# Patient Record
Sex: Female | Born: 1975 | Hispanic: Yes | Marital: Single | Smoking: Current some day smoker
Health system: Southern US, Community
[De-identification: ages and names within clinical notes are randomized; demographics above are authoritative.]

## PROBLEM LIST (undated history)

## (undated) ENCOUNTER — Inpatient Hospital Stay (HOSPITAL_COMMUNITY): Payer: Self-pay

## (undated) DIAGNOSIS — O368190 Decreased fetal movements, unspecified trimester, not applicable or unspecified: Secondary | ICD-10-CM

## (undated) DIAGNOSIS — Z8742 Personal history of other diseases of the female genital tract: Secondary | ICD-10-CM

## (undated) DIAGNOSIS — M5430 Sciatica, unspecified side: Secondary | ICD-10-CM

## (undated) DIAGNOSIS — D649 Anemia, unspecified: Secondary | ICD-10-CM

## (undated) DIAGNOSIS — E119 Type 2 diabetes mellitus without complications: Secondary | ICD-10-CM

## (undated) DIAGNOSIS — Z789 Other specified health status: Secondary | ICD-10-CM

## (undated) HISTORY — PX: KNEE SURGERY: SHX244

---

## 2007-01-07 HISTORY — PX: ABDOMINAL SURGERY: SHX537

## 2013-09-14 ENCOUNTER — Encounter: Payer: Self-pay | Admitting: Gynecology

## 2013-09-14 ENCOUNTER — Ambulatory Visit (INDEPENDENT_AMBULATORY_CARE_PROVIDER_SITE_OTHER): Payer: BC Managed Care – PPO | Admitting: Gynecology

## 2013-09-14 ENCOUNTER — Other Ambulatory Visit (HOSPITAL_COMMUNITY)
Admission: RE | Admit: 2013-09-14 | Discharge: 2013-09-14 | Disposition: A | Discharge status: Home / Self Care | Payer: BC Managed Care – PPO | Source: Ambulatory Visit | Attending: Gynecology | Admitting: Gynecology

## 2013-09-14 ENCOUNTER — Other Ambulatory Visit: Payer: BC Managed Care – PPO

## 2013-09-14 ENCOUNTER — Other Ambulatory Visit: Payer: Self-pay | Admitting: Gynecology

## 2013-09-14 ENCOUNTER — Other Ambulatory Visit: Payer: Self-pay | Admitting: Anesthesiology

## 2013-09-14 VITALS — BP 116/72 | Ht 73.0 in | Wt 275.0 lb

## 2013-09-14 DIAGNOSIS — N979 Female infertility, unspecified: Secondary | ICD-10-CM

## 2013-09-14 DIAGNOSIS — Z01419 Encounter for gynecological examination (general) (routine) without abnormal findings: Secondary | ICD-10-CM | POA: Insufficient documentation

## 2013-09-14 DIAGNOSIS — E663 Overweight: Secondary | ICD-10-CM

## 2013-09-14 DIAGNOSIS — Z1151 Encounter for screening for human papillomavirus (HPV): Secondary | ICD-10-CM | POA: Insufficient documentation

## 2013-09-14 DIAGNOSIS — F17200 Nicotine dependence, unspecified, uncomplicated: Secondary | ICD-10-CM

## 2013-09-14 DIAGNOSIS — Z9884 Bariatric surgery status: Secondary | ICD-10-CM | POA: Insufficient documentation

## 2013-09-14 MED ORDER — DOXYCYCLINE HYCLATE 100 MG PO CAPS: 100.0000 mg | ORAL_CAPSULE | Freq: Two times a day (BID) | ORAL | Status: DC

## 2013-09-14 NOTE — Patient Instructions (Addendum)
Histerosalpingografa (Hysterosalpingography) La histerosalpingografa es un procedimiento para observar el interior del tero y las trompas de [place]. Durante este procedimiento, se inyecta una sustancia de contraste en el tero, a travs de la vagina y el cuello uterino para poder visualizar el tero mientras se toman imgenes radiogrficas. Este procedimiento puede ayudar al mdico a diagnosticar tumores, adherencias o anormalidades estructurales en el tero. Generalmente se [place] para determinar las razones por las que una mujer no puede tener hijos (infertilidad). El procedimiento suele durar entre 15 y 73 minutos. INFORME A SU MDICO:  Cualquier alergia que tenga.  Todos los [employer] [place], incluyendo vitaminas, hierbas, gotas oftlmicas, cremas y medicamentos de venta libre.  Problemas previos que usted o los [employer] de su familia hayan tenido con el uso de anestsicos.  Enfermedades de [employer].  Cirugas previas.  Padecimientos mdicos. RIESGOS Y COMPLICACIONES  Generalmente es un procedimiento seguro. Sin embargo, Games developer procedimiento, pueden surgir complicaciones. Las complicaciones posibles son:  Infeccin en la membrana que recubre interiormente al tero (endometritis) o en las trompas de [place] (salpingitis).  Triumph trompas de [place].  Reaccin alrgica a la sustancia de [place] para tomar la radiografa. ANTES DEL PROCEDIMIENTO   Debe planificar el procedimiento para despus que finalice su perodo, pero antes de su prxima ovulacin. Esto ocurre por lo general [employer] 5 y 60 de su ltimo perodo. El Da 1 es Engineer, manufacturing systems de su perodo.  Consulte a su mdico si debe cambiar o suspender los medicamentos que toma habitualmente.  Podr comer y beber normalmente.  Antes de comenzar el procedimiento vace la vejiga. PROCEDIMIENTO  Le administrarn un medicamento para relajarse (sedante) o un  analgsico de venta libre para Public house manager las molestias durante el procedimiento.  Deber acostarse en una mesa de radiografas con los pies en los estribos.  Le colocarn en la vagina un dispositivo llamado espculo. Esto permite al mdico observar el interior de la vagina hasta el cuello del tero.  El cuello del tero se lava con un jabn especial.  Luego se pasa un tubo delgado y flexible a travs del cuello del tero hasta el tero.  Por el tubo se colocar una sustancia de [place].  Le tomarn varias radiografas a medida que el contraste pasa a travs del tero y las trompas de [place].  Despus del procedimiento se retira el tubo. DESPUS DEL PROCEDIMIENTO   La mayor parte de la sustancia de contraste se eliminar naturalmente. Podra ser necesario que use un apsito sanitario.  Podr sentir clicos leves.  Pregunte cuando estarn listos los Nation[employer] Mutual Insurance. Asegrese de [employer]. Document Released: 11/17/2011 Document Revised: 04/27/2013 [facility] Patient Information 2014 [place], [place]. Vacuna antigripal (vacuna antigripal inactivada) 2013 2014, Lo que debe saber  (Influenza Vaccine [Flu Vaccine, Inactivated] 2013 2014, What You Need to Know) PORQU VACUNARSE?   La influenza ("gripe") es una enfermedad contagiosa que se propaga por los [place] en invierno, por lo general entre octubre y [place].  La causa de la gripe es el virus de la influenza, y se puede contagiar por la tos, al estornudar y por el contacto cercano.  Cualquier persona puede Emergency planning/management officer gripe, Armed forces training and education officer el riesgo es mayor entre los nios. Los sntomas aparecen rpidamente y [place]. Pueden ser:  Cristy Hilts o escalofros.  Dolor de Investment banker, operational.  Dolores musculares.  La fatiga.  Tos.  Dolor de [place].  Secrecin o congestin nasal. La  gripe puede hacer que Kohl's se enfermen ms que otros. Entre Arrow Electronics se incluyen a los nios pequeos, las  The First American de 21 aos, las mujeres embarazadas y las personas con Armed forces training and education officer, como enfermedades cardacas, pulmonares o renales, o que tienen un sistema inmunolgico debilitado. La vacuna contra la gripe es especialmente importante para estas personas y para todos los que estn en estrecho contacto con ellos.  La gripe tambin puede causar neumona y [place] afecciones existentes. En los nios, puede provocar diarrea y convulsiones.  Cada ao miles de Goldman Sachs [place] debido a la gripe y muchos ms deben ser hospitalizados.  La vacuna contra la gripe es la mejor proteccin que existe contra la gripe y sus complicaciones. La vacuna contra la gripe tambin ayuda a prevenir la propagacin de la gripe de Ardelia Mems persona a Theatre manager.  VACUNA INACTIVADA CONTRA LA GRIPE  Hay dos tipos de vacunas contra la gripe:   Usted recibir la vacuna de la gripe inactivada, que no contiene virus vivo. Se administra en forma de inyeccin con Maxwell Caul y se llama la "vacuna antigripal".  Otro tipo de vacuna con virus vivos, atenuados (debilitados), se aplica en forma de aerosol en las fosas nasales. Esta vacuna se describe en el apartado Informacin sobre las vacunas. Se recomienda aplicarse la vacuna contra la gripe todos los [place]. Los nios [employer] 6 meses y los 8 aos de edad deben recibir 2 dosis Tax inspector que se vacunen.  Los virus de la gripe [place] constantemente. Cada ao, la vacuna contra la gripe se actualiza para proteger contra los virus que tienen ms probabilidades de causar la enfermedad ese ao. Aunque la vacuna no puede prevenir todos los casos de gripe, es nuestra mejor defensa contra la enfermedad. Vacuna contra la gripe inactivada protege contra 3 o 4 virus diferentes.  Se tarda aproximadamente 2 semanas para desarrollar la proteccin despus de la vacunacin y la proteccin dura entre algunos meses y un ao.  Muchas veces se confunden con la gripe algunas  enfermedades que no son causadas por el virus de la gripe. La vacuna contra la gripe no previene estas enfermedades. Slo se puede prevenir la gripe.  Para las personas de ms de 65 aos, se dispone de una vacuna contra la gripe de "dosis elevada". La persona que aplica la vacuna puede darle ms informacin al respecto.  Algunas de las vacunas contra la gripe inactivada contienen una cantidad muy pequea de un conservante a base de mercurio llamado timerosal. Algunos estudios han demostrado que el timerosal en las vacunas no es perjudicial, pero se dispone de vacunas contra la gripe que no contienen el conservante.  ALGUNAS PERSONAS NO DEBEN RECIBIR ESTA VACUNA Informe a la persona que le aplica la vacuna:   Si sufre alguna alergia grave (que pone en peligro la vida). Si alguna vez tuvo una reaccin alrgica potencialmente mortal despus de Ardelia Mems dosis de la vacuna contra la gripe, o tuvo una alergia grave a cualquiera de los componentes de [place], es posible que se le recomiende no recibir una dosis. [place] vacunas contra la gripe, aunque no todas, contienen una pequea cantidad de [place].  Si alguna vez ha sufrido el sndrome de Curator (una enfermedad paralizante grave tambin llamada GBS). Algunas personas con antecedentes de GBS no deben recibir esta vacuna. Debe comentarlo con su mdico.  Si no se siente bien. Podran sugerirle que espere hasta sentirse mejor. Pero debe volver. RIESGOS  DE UNA REACCIN A LA VACUNA Con la vacuna, como cualquier medicamento, existe la posibilidad de sufrir efectos secundarios. Suelen ser leves y desaparecen por s solos.  Los efectos secundarios graves son [place], pero son Orlene Erm raros. Vacuna de la gripe inactivada no contiene el virus vivo de la gripe, la gripe por lo tanto enfermarse por recibir la vacuna no es posible.  Episodios de desmayo leves y sntomas relacionados (tales como sacudidas) pueden presentarse despus de cualquier  procedimiento mdico, incluyendo la vacunacin. Si permanece sentado o recostado durante 15 minutos despus de la vacunacin puede ayudar a Merrill Lynch y las lesiones causadas por las cadas. Informe al mdico si se siente mareado o aturdido, tiene Harley[employer]son visin o zumbidos en los odos.  Problemas leves luego de recibir la vacuna de la gripe inactivada:   Automotive engineer, enrojecimiento o Estate agent en el que le aplicaron la vacuna.  Ronquera; dolor, inflamacin o picazn en los ojos o tos.  Cristy Hilts.  Dolores.  Dolor de [place].  Picazn.  Fatiga. Si estos problemas ocurren, en general comienzan poco despus de vacunarse y duran 1  2 das.  Problemas moderados luego de recibir la vacuna de la gripe inactivada:   Los nios que reciben la vacuna contra la gripe inactivada y TEFL teacher antineumoccica (PCV13) al mismo tiempo, pueden tener un mayor riesgo de sufrir convulsiones causadas por fiebre. Consulte a su mdico para obtener ms informacin. Informe a su mdico si un nio que est recibiendo la vacuna contra la gripe ha tenido una convulsin. Problemas graves luego de recibir la vacuna inactivada contra la gripe:   Ardelia Mems reaccin alrgica grave puede ocurrir despus de la administracin de cualquier vacuna (se estima en menos de 1 en un milln de dosis).  Hay una pequea posibilidad de que la vacuna de la gripe inactivada est asociada con el sndrome de Guillain-Barr (GBS), no ms de 1 o 2 casos por milln de personas vacunadas. Es Technical sales engineer que el riesgo de sufrir complicaciones graves por la gripe, que puede prevenirse con la vacunacin. Se controla permanentemente la seguridad de las vacunas. Para obtener ms informacin, consulte http://www.wolf.info/ vaccinesafety/  QU PASA SI HAY UNA REACCIN GRAVE?  Qu signos debo buscar?   Observe todo lo que le preocupe, como signos de una reaccin alrgica grave, fiebre muy alta o cambios en el comportamiento. Los  signos de Nurse, mental health grave pueden incluir urticaria, hinchazn de la cara y la garganta, dificultad para respirar, ritmo cardaco acelerado, mareos y debilidad. Pueden comenzar entre unos pocos minutos y algunas horas despus de la vacunacin.  Qu debo hacer?   Si usted piensa que se trata de una reaccin alrgica grave o de otra emergencia que no puede esperar, llame al 911 o lleve a la persona al hospital ms cercano. De lo contrario, llame a su mdico.  Despus, la reaccin debe informarse a la "Vaccine Adverse Event Reporting System" (Sistema de informacin sobre efectos [place]). Su mdico puede presentar este informe, o puede hacerlo usted mismo a travs del sitio web de VAERS, en [email or web address].es, o llamando al [phone]. VAERS es slo para informar reacciones. No brindan consejo mdico.  PROGRAMA NACIONAL DE COMPENSACIN DE DAOS POR [place] Vaccine Injury Compensation Program (VICP) es un programa federal que fue creado para compensar a las personas que puedan haber sufrido daos al recibir ciertas vacunas.  Aquellas personas que consideren que han sufrido un dao  como consecuencia de una vacuna y quieren saber ms acerca del programa y como presentar [place], [place] llamar al [phone] o visitar su sitio web en [email or web address].  [place] MS INFORMACIN?   Consulte a su mdico.  Comunquese con el servicio de salud de su localidad o su estado.  Comunquese con los Centros para el control y la prevencin de Probation officer for Disease Control and Prevention , CDC).  Llame al [phone] (1-800-CDC-INFO) o  Visite la pgina web de los CDC en https://gibson.com/. CDC Inactivated Influenza Vaccine Interim VIS (04/02/12)  Document Released: 11/21/2008 Document Revised: 05/19/2012 ExitCare Patient Information 2014 [place], [place]. Dejar de fumar  (Smoking Cessation) Dejar de fumar es  importante para su salud y tiene Product/process development scientist. Sin embargo, no siempre es Control and instrumentation engineer de fumar ya que la nicotina es una droga [place]. Generalmente las personas intentan 3 veces o ms antes de poder dejar de fumar. En este documento se explican las mejores formas de prepararse para dejar de fumar. Esta decisin requiere [employer] y mucho esfuerzo, pero usted puede [place].  [place]   Vivir ms, se sentir mejor y vivir mejor.  El cuerpo sentir el impacto de dejar de fumar de inmediato.  Luego de 20 minutos la presin arterial disminuye. El pulso vuelve a su nivel normal.  Despus de 8 horas, los niveles de monxido de carbono en la sangre vuelven a la normalidad. Aumenta el nivel de oxgeno.  Despus de 24 horas, la probabilidad de infarto comienza a disminuir. La respiracin, el cabello y el cuerpo ya no huelen a humo.  Luego de 48 horas, los nervios daados comienzan a recuperarse. Mejoran el sentido del gusto y Armed forces logistics/support/administrative officer.  Luego de 72 horas, el organismo est virtualmente libre de nicotina. Los conductos bronquiales se relajan, la respiracin se normaliza.  Despus de 2 a 12 semanas, los pulmones pueden contener ms aire. Se facilita la actividad fsica y mejora la respiracin.  El riesgo de sufrir un infarto, un ictus, cncer o enfermedad pulmonar disminuye en gran medida.  Despus de 1 ao, el riesgo de coronariopatas disminuye a la mitad.  Despus de 5 aos, el riesgo de ictus disminuye al nivel de un no fumador.  Despus de 10 aos, el riesgo de cncer de pulmn disminuye a la mitad, y el riesgo de sufrir otros tipos de cncer disminuye considerablemente.  Despus de 15 aos, el riesgo de enfermedad coronaria disminuye, generalmente al nivel de un no fumador.  Si est embarazada, al dejar de fumar aumentar las probabilidades de tener un beb sano.  Las personas con las que convive, Nation[employer] Insurance nios, estarn ms saludables.  Tendr  dinero extra para gastar en otras cosas que no sean cigarrillos. PREGUNTAS PARA PENSAR ANTES DE Allena Katz DEJAR DE FUMAR  Quizs desee hablar acerca de sus preguntas con el mdico.   Por qu desea dejar de fumar?  Cuando trat de dejar de fumar en el pasado, qu lo ayud y qu no lo ayud?  Cules sern las situaciones ms difciles para usted despus de dejar de fumar? Cmo planea manejarlas?  Quin puede ayudarlo en los momentos difciles? Su familia? Sus amigos? Un profesional?  Qu placeres obtiene cuando fuma? De qu manera puede seguir obteniendo placer si abandona el hbito? Estas son algunas preguntas para hacrselas al profesional.   Cmo puede ayudarme a dejar de fumar con xito?  Qu medicamento cree que sera el mejor para m, y cmo debo tomarlo?  Qu debo hacer si necesito ms ayuda?  Cmo es la desintoxicacin del cigarrillo? Cmo puedo obtener informacin acerca de la desintoxicacin? Preprese   Establezca una fecha para dejar de fumar.  Cambie su entorno, deshacindose de los cigarrillos, ceniceros, fsforos y encendedores en su casa, el auto o el [place]. No permita que nadie fume dentro de su casa.  Repase sus intentos anteriores. Piense en qu cosas funcionaron y cules no. BUSQUE AYUDA Y ESTMULO  Usted tiene mejores probabilidades de tener xito si cuenta con ayuda. Puede obtener apoyo de [employer]:   Dgale a sus familiares, amigos y compaeros de trabajo que usted dejar de fumar y que necesita su apoyo. Pdales que no fumen a su alrededor.  Obtenga consejo y 78 individual, grupal o telefnico. Hay programas que se ofrecen en hospitales y centros mdicos locales. Comunquese con el departamento de salud de su localidad para obtener informacin acerca de los programas disponibles en su rea.  Las creencias y prcticas espirituales pueden ayudar a los fumadores a abandonar el hbito.  Descargue en su computadora un programa que  registre sus estadsticas, por ejemplo, cunto hace que no fuma, la cantidad de cigarrillos que no ha fumado y el dinero ahorrado.  Consiga un libro de [place] sobre dejar de fumar y Dispensing optician del tabaco. Aprenda nuevas destrezas y conductas   Trate de entretenerse con otra cosa cuando sienta ganas de fumar. Hable con alguien, salga a caminar u ocpese en alguna tarea.  Cambie su rutina habitual. Salley Hews ruta diferente para llegar al [place]. Beba t en vez de caf. Desayune en un lugar diferente.  Reduzca las situaciones de estrs. Tome un bao caliente, practique alguna actividad fsica o lea un libro.  Planee hacer cada da algo que disfrute. Recompnsese por no fumar.  Explore programas interactivos en la web dedicados a ayudar a dejar de fumar. CONSIGA MEDICAMENTOS Y SELOS CORRECTAMENTE  Algunos medicamentos pueden ayudar a dejar de fumar y Equities trader la necesidad de tabaco. Oceanographer los medicamentos con las conductas y mtodos de apoyo ya mencionados puede aumentar en gran medida sus posibilidades de dejar de fumar con xito.   La terapia de reemplazo de nicotina enva nicotina al organismo sin los efectos negativos y los riesgos del fumar. La terapia de reemplazo de nicotina incluye chicles, pastillas, inhaladores nasales en aerosol y parches para la piel de nicotina. Algunos son de Rogelia Rohrer y otros requieren una receta mdica.  Los antidepresivos ayudan a las personas a Personal assistant de Copy, Armed forces training and education officer no se conoce cul es el mecanismo. Se venden bajo receta mdica.  Los [employer]el[employer] Corporation parciales de los receptores de nicotina simulan el efecto de la nicotina en el cerebro. Se venden bajo receta mdica. Pdale a su mdico que lo aconseje [employer] Corporation que debe Risk manager y cmo utilizarlos en base a su historia clnica. El mdico le dir qu efectos secundarios deber Best boy en cuenta si decide utilizar un medicamento o seguir un tratamiento. Lea cuidadosamente la informacin en el envase.  No utilice cualquier otro producto que contenga nicotina durante el uso de un producto de reemplazo de nicotina.  [place] parte de las recadas se producen dentro de los 3 primeros meses de abandonar el hbito. No  se desanime si comienza a fumar de nuevo. Recuerde, la [employer] tratan varias veces de dejar de fumar antes de lograrlo. Podr sufrir sndrome de abstinencia porque su cuerpo est acostumbrado a la nicotina. Podr sentir el deseo  compulsivo de fumar, irritabilidad, enojo, tos, cefaleas y dificultad para concentrarse. Estos sntomas son transitorios. Son ms intensos en un comienzo, pero desaparecern en 10 a 14 das.  Para reducir las probabilidades de fracaso, trate de:   Evitar el consumo alcohol. El beber disminuye sus posibilidades de xito.  Disminuya el consumo de cafena. Una vez que deje de fumar, la cantidad de cafena en su organismo aumenta y puede darle sntomas, como frecuencia cardaca rpida, sudoracin y ansiedad.  Evite a las [employer] fuman porque pueden hacer que usted desee [place].  No deje que el aumento de peso distraiga su objetivo. Muchos fumadores aumentarn de peso cuando dejen de fumar, generalmente menos de 4,5 Kg. Consuma una dieta saludable y [place]. Siempre podr perder [employer] que se gane despus de dejar de fumar.  Encuentre formas de mejorar su estado de nimo que no sean fumando PARA OBTENER MS INFORMACIN  www.smokefree.gov  Document Released: 08/25/2005 Document Revised: 02/24/2012 [facility] Patient Information 2014 [place], [place].   LLamar a la oficina cuando comienze el periodo para: 1. HSG 2. Analysis de semen 3. Cita con Dr. Rosita Kea despues de HSG  Tomar antibiotico dos veces al dia comenzando el dia antes del HSG por tres dias  tomar vitamina prenata;l diario

## 2013-09-14 NOTE — Progress Notes (Signed)
Monica Buchanan 05/12/1976 [id number]   History:    38 y.o.  for annual gyn exam who is a new patient to the practice. Patient has been in this country for 3 years from [place]. She stated that her primary physician over a year and a half ago removed her IUD because she wanted to get pregnant. She believes they did a Pap smear. She denies any prior history of abnormal Pap smear. She has had one child in the past 13 years ago delivered by cesarean section and has remarried and has been with same partner for 5 years. She has stated that for the past year and a half she has been unable to conceive. Patient has normal menstrual cycles. She is morbidly obese whereby she weighs 275 pounds but she is 6 feet 1 inches tall. Her BMI is 36.28 kg per meter square. 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 She stated her maximum weight was 370 pounds. She stated that she did not have any fertility issues when she conceived 13 years ago. Patient denies any prior history of abnormal Pap smear. She does smoke and is down to 3 cigarettes per day. Her last menstrual period was December 21. Her primary physician is Dr. Marliss Czar which she has not seen an ovary year.  Past medical history,surgical history, family history and social history were all reviewed and documented in the EPIC chart.  Gynecologic History Patient's last menstrual period was 08/28/2013. Contraception: none Last Pap: approximately 2 years ago?Marland Kitchen Results were: normal Last mammogram: none indicated. Results were: not indicated  Obstetric History OB History  Gravida Para Term Preterm AB SAB TAB Ectopic Multiple Living  1 1        1     # Outcome Date GA Lbr Len/2nd Weight Sex Delivery Anes PTL Lv  1 PAR                ROS: A ROS was performed and pertinent positives and negatives are included in the history.  GENERAL: No fevers or chills. HEENT: No change in vision, no earache, sore throat or sinus congestion. NECK: No pain or  stiffness. CARDIOVASCULAR: No chest pain or pressure. No palpitations. PULMONARY: No shortness of breath, cough or wheeze. GASTROINTESTINAL: No abdominal pain, nausea, vomiting or diarrhea, melena or bright red blood per rectum. GENITOURINARY: No urinary frequency, urgency, hesitancy or dysuria. MUSCULOSKELETAL: No joint or muscle pain, no back pain, no recent trauma. DERMATOLOGIC: No rash, no itching, no lesions. ENDOCRINE: No polyuria, polydipsia, no heat or cold intolerance. No recent change in weight. HEMATOLOGICAL: No anemia or easy bruising or bleeding. NEUROLOGIC: No headache, seizures, numbness, tingling or weakness. PSYCHIATRIC: No depression, no loss of interest in normal activity or change in sleep pattern.     Exam: chaperone present  Ht 6\' 1"  (1.854 m)  Wt 275 lb (124.739 kg)  BMI 36.29 kg/m2  LMP 08/28/2013  Body mass index is 36.29 kg/(m^2).  General appearance : Well developed well nourished female. No acute distress HEENT: Neck supple, trachea midline, no carotid bruits, no thyroidmegaly Lungs: Clear to auscultation, no rhonchi or wheezes, or rib retractions  Heart: Regular rate and rhythm, no murmurs or gallops Breast:Examined in sitting and supine position were symmetrical in appearance, no palpable masses or tenderness,  no skin retraction, no nipple inversion, no nipple discharge, no skin discoloration, no axillary or supraclavicular lymphadenopathy Abdomen: no palpable masses or tenderness, no rebound or guarding Extremities: no edema or skin discoloration or  tenderness  Pelvic:  Bartholin, Urethra, Skene Glands: Within normal limits             Vagina: No gross lesions or discharge  Cervix: No gross lesions or discharge  Uterus  anteverted, normal size, shape and consistency, non-tender and mobile  Adnexa  Without masses or tenderness  Anus and perineum  normal   Rectovaginal  normal sphincter tone without palpated masses or tenderness             Hemoccult none  indicated     Assessment/Plan:  38 y.o. 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 Patient with normal menstrual cycle. Patient with history of gastric bypass in 2008 and also a smoker. We discussed the detrimental effects of smoking which could contribute to infertility as well as her morbid obesity. She will return back to the office at the end of the week which will be day 20 of her cycle for a serum progesterone level. Patient will then call the office when she starts her menses so that we can schedule an HSG as well as a semen analysis on her spouse and then to see me one week after to discuss results. She is passing today and we will obtain the following labs: CBC, TSH, comprehensive metabolic panel, fasting lipid profile, urinalysis as well as Pap smear. We did discuss the new screening guidelines. Patient did received the flu vaccine today. Of note patient was given prescription Vibramycin 100 mg to take one by mouth twice a day for 3 days starting the day before her HSG for prophylaxis. We also discussed the importance of regular exercise drop nutrition and to begin taking prenatal vitamins. All the above instructions were provided in Spanish and provided in written format.  Note: This dictation was prepared with  Dragon/digital dictation along withSmart phrase technology. Any transcriptional errors that result from this process are unintentional.   Terrance Mass MD, 10:32 AM 09/14/2013

## 2013-09-15 LAB — URINALYSIS W MICROSCOPIC + REFLEX CULTURE
Bacteria, UA: NONE SEEN
Bilirubin Urine: NEGATIVE
CRYSTALS: NONE SEEN
Casts: NONE SEEN
Glucose, UA: NEGATIVE mg/dL
Hgb urine dipstick: NEGATIVE
Ketones, ur: NEGATIVE mg/dL
LEUKOCYTES UA: NEGATIVE
NITRITE: NEGATIVE
Protein, ur: NEGATIVE mg/dL
SPECIFIC GRAVITY, URINE: 1.03 (ref 1.005–1.030)
UROBILINOGEN UA: 0.2 mg/dL (ref 0.0–1.0)
pH: 6 (ref 5.0–8.0)

## 2013-09-15 LAB — HIV ANTIBODY (ROUTINE TESTING W REFLEX): HIV: NONREACTIVE

## 2013-09-15 LAB — OTHER SOLSTAS TEST
HCV AB: NEGATIVE
HEP B S AG: NEGATIVE

## 2013-09-16 ENCOUNTER — Other Ambulatory Visit: Payer: BC Managed Care – PPO

## 2013-09-16 DIAGNOSIS — E663 Overweight: Secondary | ICD-10-CM

## 2013-09-16 DIAGNOSIS — N979 Female infertility, unspecified: Secondary | ICD-10-CM

## 2013-09-16 DIAGNOSIS — Z01419 Encounter for gynecological examination (general) (routine) without abnormal findings: Secondary | ICD-10-CM

## 2013-09-16 LAB — COMPREHENSIVE METABOLIC PANEL
ALBUMIN: 3.9 g/dL (ref 3.5–5.2)
ALT: 9 U/L (ref 0–35)
AST: 14 U/L (ref 0–37)
Alkaline Phosphatase: 51 U/L (ref 39–117)
BUN: 8 mg/dL (ref 6–23)
CALCIUM: 8.9 mg/dL (ref 8.4–10.5)
CHLORIDE: 104 meq/L (ref 96–112)
CO2: 26 mEq/L (ref 19–32)
Creat: 0.73 mg/dL (ref 0.50–1.10)
GLUCOSE: 86 mg/dL (ref 70–99)
POTASSIUM: 4.3 meq/L (ref 3.5–5.3)
Sodium: 137 mEq/L (ref 135–145)
Total Bilirubin: 0.3 mg/dL (ref 0.3–1.2)
Total Protein: 6.9 g/dL (ref 6.0–8.3)

## 2013-09-16 LAB — CBC WITH DIFFERENTIAL/PLATELET
BASOS ABS: 0 10*3/uL (ref 0.0–0.1)
BASOS PCT: 0 % (ref 0–1)
EOS ABS: 0.1 10*3/uL (ref 0.0–0.7)
Eosinophils Relative: 2 % (ref 0–5)
HCT: 30.7 % — ABNORMAL LOW (ref 36.0–46.0)
Hemoglobin: 9.8 g/dL — ABNORMAL LOW (ref 12.0–15.0)
LYMPHS ABS: 1.9 10*3/uL (ref 0.7–4.0)
Lymphocytes Relative: 28 % (ref 12–46)
MCH: 23.4 pg — AB (ref 26.0–34.0)
MCHC: 31.9 g/dL (ref 30.0–36.0)
MCV: 73.3 fL — ABNORMAL LOW (ref 78.0–100.0)
Monocytes Absolute: 0.6 10*3/uL (ref 0.1–1.0)
Monocytes Relative: 9 % (ref 3–12)
NEUTROS PCT: 61 % (ref 43–77)
Neutro Abs: 4 10*3/uL (ref 1.7–7.7)
Platelets: 441 10*3/uL — ABNORMAL HIGH (ref 150–400)
RBC: 4.19 MIL/uL (ref 3.87–5.11)
RDW: 16.1 % — ABNORMAL HIGH (ref 11.5–15.5)
WBC: 6.7 10*3/uL (ref 4.0–10.5)

## 2013-09-16 LAB — LIPID PANEL
CHOL/HDL RATIO: 3.7 ratio
CHOLESTEROL: 150 mg/dL (ref 0–200)
HDL: 41 mg/dL (ref >39)
LDL Cholesterol: 83 mg/dL (ref 0–99)
Triglycerides: 131 mg/dL (ref <150)
VLDL: 26 mg/dL (ref 0–40)

## 2013-09-17 LAB — PROGESTERONE: Progesterone: 6.7 ng/mL

## 2013-09-17 LAB — TSH: TSH: 1.418 u[IU]/mL (ref 0.350–4.500)

## 2013-09-19 ENCOUNTER — Other Ambulatory Visit: Payer: Self-pay | Admitting: Gynecology

## 2013-09-19 DIAGNOSIS — D649 Anemia, unspecified: Secondary | ICD-10-CM

## 2013-09-26 ENCOUNTER — Telehealth: Payer: Self-pay | Admitting: *Deleted

## 2013-09-26 DIAGNOSIS — IMO0002 Reserved for concepts with insufficient information to code with codable children: Secondary | ICD-10-CM

## 2013-09-26 NOTE — Telephone Encounter (Signed)
Appointment on 09/30/13 11:00 am Monica Buchanan will inform patient.

## 2013-09-26 NOTE — Telephone Encounter (Signed)
Message copied by Thamas Jaegers on Mon Sep 26, 2013  3:30 PM ------      Message from: Sinclair Grooms      Created: Mon Sep 26, 2013 11:07 AM      Regarding: hsg       Patient needs appointment for HSG. LMP 09-24-13. She speaks english or I can call her back. Thx ------

## 2013-09-30 ENCOUNTER — Ambulatory Visit (HOSPITAL_COMMUNITY)
Admission: RE | Admit: 2013-09-30 | Discharge: 2013-09-30 | Disposition: A | Discharge status: Home / Self Care | Payer: BC Managed Care – PPO | Source: Ambulatory Visit | Attending: Gynecology | Admitting: Gynecology

## 2013-09-30 DIAGNOSIS — IMO0002 Reserved for concepts with insufficient information to code with codable children: Secondary | ICD-10-CM

## 2013-09-30 DIAGNOSIS — N979 Female infertility, unspecified: Secondary | ICD-10-CM | POA: Insufficient documentation

## 2013-09-30 MED ORDER — IOHEXOL 300 MG/ML  SOLN
20.0000 mL | Freq: Once | INTRAMUSCULAR | Status: AC | PRN
  Administered 2013-09-30: 10 mL

## 2013-10-03 ENCOUNTER — Ambulatory Visit (HOSPITAL_COMMUNITY): Payer: BC Managed Care – PPO

## 2014-05-16 ENCOUNTER — Ambulatory Visit (INDEPENDENT_AMBULATORY_CARE_PROVIDER_SITE_OTHER): Payer: BC Managed Care – PPO | Admitting: Gynecology

## 2014-05-16 ENCOUNTER — Ambulatory Visit: Payer: BC Managed Care – PPO | Admitting: Gynecology

## 2014-05-16 ENCOUNTER — Encounter: Payer: Self-pay | Admitting: Gynecology

## 2014-05-16 VITALS — BP 122/80

## 2014-05-16 DIAGNOSIS — N920 Excessive and frequent menstruation with regular cycle: Secondary | ICD-10-CM

## 2014-05-16 DIAGNOSIS — E663 Overweight: Secondary | ICD-10-CM

## 2014-05-16 DIAGNOSIS — Z3169 Encounter for other general counseling and advice on procreation: Secondary | ICD-10-CM

## 2014-05-16 DIAGNOSIS — D509 Iron deficiency anemia, unspecified: Secondary | ICD-10-CM

## 2014-05-16 LAB — CBC WITH DIFFERENTIAL/PLATELET
Basophils Absolute: 0 10*3/uL (ref 0.0–0.1)
Basophils Relative: 0 % (ref 0–1)
EOS ABS: 0.1 10*3/uL (ref 0.0–0.7)
EOS PCT: 1 % (ref 0–5)
HCT: 36 % (ref 36.0–46.0)
Hemoglobin: 11.9 g/dL — ABNORMAL LOW (ref 12.0–15.0)
Lymphocytes Relative: 20 % (ref 12–46)
Lymphs Abs: 1.8 10*3/uL (ref 0.7–4.0)
MCH: 28.7 pg (ref 26.0–34.0)
MCHC: 33.1 g/dL (ref 30.0–36.0)
MCV: 86.7 fL (ref 78.0–100.0)
Monocytes Absolute: 0.8 10*3/uL (ref 0.1–1.0)
Monocytes Relative: 9 % (ref 3–12)
NEUTROS PCT: 70 % (ref 43–77)
Neutro Abs: 6.2 10*3/uL (ref 1.7–7.7)
Platelets: 405 10*3/uL — ABNORMAL HIGH (ref 150–400)
RBC: 4.15 MIL/uL (ref 3.87–5.11)
RDW: 13.5 % (ref 11.5–15.5)
WBC: 8.8 10*3/uL (ref 4.0–10.5)

## 2014-05-16 NOTE — Progress Notes (Addendum)
   Patient is a 38 year old gravida 1 para 1 who presents to the office with her husband to discuss her HSG was done several months ago. She was seen in the office for the first time as a new patient in January of this year. Patient had a child with another partner 13 years ago delivered via cesarean section. She had an IUD removed a year and a half ago and has been unable to conceive.She is morbidly obese whereby she weighs 275 pounds but she is 6 feet 1 inches tall. Her BMI is 36.28 kg per meter square. 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 She stated her maximum weight was 370 pounds. She stated that she did not have any fertility issues when she conceived 13 years ago.  She did not followup until  Today. Her HSG was in January 2015 with the following results:  FINDINGS:  Normal early filling of the uterine cavity.  Bilateral fallopian tubes are within normal limits.  Satisfactory peritoneal spill bilaterally.   Her husband has not had a semen analysis yet. She does have regular cycles but they last 5-7 days. Back in January on day 20 she had a serum progesterone level with a value of 6.7. She was also found to be anemic with a hemoglobin 9.8 and hematocrit 30.7 with a low MCV and low MCH. She is taking iron tablet after that but has stopped it for the past 2 months as well as her prenatal vitamins.  Assessment/plan: 38 year old gravida 1 para 1 morbidly obese remarried unable to conceive past year and a half having normal menstrual cycle although heavy. Normal HSG. Her husband will need to schedule semen analysis and then return to the office for consultation for possible consideration of three-month trial of clomiphene citrate if not I have recommended that she be referred to reproductive endocrinologist for IVF. Instructions were provided in Spanish. She was reminded to start her prenatal vitamins and iron tablet daily. We will recheck her CBC today as well.

## 2014-05-16 NOTE — Patient Instructions (Signed)
Fertilizacin in vitro  (In Vitro Fertilization) La fertilizacin in vitro (FIV) es un tipo de Chartered certified accountant aplicada a la reproduccin asistida. La FIV consiste en una serie de procedimientos para tratar la infertilidad o problemas genticos con el objeto de prestar asistencia para la concepcin de un beb. Durante la FIV, los vulos se recuperan de los ovarios y se combinan con los espermatozoides en el laboratorio para fertilizarlos. Uno o ms vulos fertilizados (embriones) se insertan en el tero a travs del cuello uterino. Las candidatas para la FIV son:  Personas que sufren infertilidad.  Las mujeres que sufren una menopausia prematura o una falla ovrica.  Las mujeres a las que se les han extirpado ambos ovarios. En este caso, deber usarse una donante de vulos.  Mujeres que tienen daadas u obstruidas las trompas de [place] No hay lmite de edad para la FIV, pero no se recomienda para las mujeres postmenopusicas. La edad ideal para este procedimiento es de 80 aos o menos. A las mujeres de ms de 41 aos generalmente se les aconseja utilizar una donante de vulos durante la FIV para aumentar las probabilidades de [place]. INFORME A SU MDICO:   Cualquier alergia que tenga.  Todos los [employer] [place], incluyendo vitaminas, hierbas, gotas oftlmicas, cremas y medicamentos de venta libre.  Problemas previos que usted o los [employer] de su familia hayan tenido con el uso de anestsicos.  Todo problema mdico o gentico que usted o los miembros de su familia hayan sufrido.  Enfermedades de [employer].  Cirugas previas.  Embarazos previos.  Padecimientos mdicos.  Excesos en el consumo de alcohol o de tabaco.  Historia de consumo de drogas. RIESGOS Y COMPLICACIONES  Generalmente, el procedimiento de FIV es un procedimiento seguro. Sin embargo, Games developer procedimiento, pueden surgir complicaciones. Las complicaciones posibles son:  Almon Hercules o  infeccin.  Problemas con la anestesia.  Cogulos sanguneos.  El procedimiento no tiene xito.  Tener gemelos o embarazos mltiples  Aumento del riesgo de [place]. ANTES DEL PROCEDIMIENTO  Antes de comenzar el ciclo de FIV, usted y el donante sern sometidos a estudios para asegurarse de que la FIV es la mejor opcin. Algunas personas pueden no beneficiarse con este tipo de tecnologa para la asistencia reproductiva.   Usted y Transport planner tendrn que proporcionar una historia clnica completa y la historia clnica de sus familiares.  Usted y Transport planner sern sometidos a un examen fsico.  Usted y el donante podrn necesitar realizarse anlisis de sangre para detectar enfermedades infecciosas, incluyendo el VIH.  Podrn solicitarle otros estudios, por ejemplo:  Estudio de los ovarios para determinar la calidad y la cantidad de vulos.  Estudios hormonales y de ovulacin.  Un examen del tero. Este se realiza [employer] de un tipo de Magazine features editor, despus de Tour manager un lquido en el tero a travs del cuello uterino (ecohisterografa o utilizando un tubo delgado y flexible, con Ardelia Mems pequea luz y cmara en un extremo (histeroscopio).  La esperma del donante ser tomada y [place] para ver si es normal, hay cantidad suficiente para fertilizar el vulo y que actan normalmente despus de las relaciones sexuales (examen postcoital ). PROCEDIMIENTO  La FIV consiste en varios pasos. Estos procedimientos pueden Associate Professor mdico o en una clnica. Un ciclo de FIV puede llevar alrededor de 2 semanas, y podr requerirse ms de un ciclo. Los pasos de la FIV son:  IT consultant. Si se utilizan sus vulos durante la FIV, al comienzo del ciclo  comenzar un tratamiento con hormonas artificiales (sintticas. Estas hormonas estimulan los ovarios para producir mltiples vulos, a diferencia del vulo nico que normalmente se desarrolla cada mes. Es necesario obtener  mltiples vulos debido a que algunos de ellos no se fertilizarn o no se desarrollarn normalmente despus de Ambulance person.  Extraccin de vulos. Utilizando imgenes radiogrficas como gua, el medico insertar una aguja fina a travs de la vagina y IT trainer a los ovarios y Marine scientist (folculos) que contienen los vulos La aguja se Geophysical data processor a un dispositivo de succin, que retira los vulos y el lquido de cada folculo, uno por vez. El procedimiento se repite para el otro ovario.  Inseminacin y fertilizacin. El esperma se une a los ovarios (inseminacin) y se Radiographer, therapeutic en una cmara que tiene un ambiente controlado. Generalmente el esperma ingresa (fertiliza) el vulo unas horas despus de la inseminacin.  Transferencia embrionaria. El Investment banker, operational los embriones en su tero usando un tubo delgado (catter) que contiene los embriones. El catter se inserta en la vagina a travs del cuello uterino, y de all al tero. La transferencia de vulos generalmente ocurre entre 2 a 6 das luego de la extraccin de los vulos. Si hay xito, el embrin se adherir (implante) en la superficie interna del tero alrededor de 6 a 10 das luego de la extraccin de los vulos. Si se implanta el embrin en la membrana que cubre el tero y se desarrolla, resulta en un embarazo. DESPUS DEL PROCEDIMIENTO   Tendr que permanecer acostada durante una hora o ms, antes de volver a su casa.  Ser necesario que siga con la terapia hormonal por 3 meses aproximadamente, o segn lo que le indique su mdico.  Puede retomar su dieta y las actividades habituales. Document Released: 04/27/2013 [facility] Patient Information 2015 [place]. This information is not intended to replace advice given to you by your health care provider. Make sure you discuss any questions you have with your health care provider.

## 2014-05-24 ENCOUNTER — Encounter: Payer: Self-pay | Admitting: Gynecology

## 2014-06-12 ENCOUNTER — Ambulatory Visit (INDEPENDENT_AMBULATORY_CARE_PROVIDER_SITE_OTHER): Payer: BC Managed Care – PPO | Admitting: Gynecology

## 2014-06-12 ENCOUNTER — Encounter: Payer: Self-pay | Admitting: Gynecology

## 2014-06-12 VITALS — BP 126/78

## 2014-06-12 DIAGNOSIS — E663 Overweight: Secondary | ICD-10-CM

## 2014-06-12 DIAGNOSIS — Z23 Encounter for immunization: Secondary | ICD-10-CM

## 2014-06-12 DIAGNOSIS — N979 Female infertility, unspecified: Secondary | ICD-10-CM

## 2014-06-12 MED ORDER — CLOMIPHENE CITRATE 50 MG PO TABS: ORAL_TABLET | ORAL | Status: DC

## 2014-06-12 MED ORDER — METFORMIN HCL 500 MG PO TABS: ORAL_TABLET | ORAL | Status: DC

## 2014-06-12 NOTE — Patient Instructions (Addendum)
Metformin tablets Qu es este medicamento? La METFORMINA se usa para tratar la diabetes tipo 2. Ayuda a controlar el nivel de azcar en la sangre. El tratamiento se combina con ejercicios y una dieta. Este medicamento se puede usar solo o con otros medicamentos para la diabetes, incluyendo la insulina. Este medicamento puede ser utilizado para otros usos; si tiene alguna pregunta consulte con su proveedor de atencin mdica o con su farmacutico. MARCAS COMERCIALES DISPONIBLES: Glucophage Qu le debo informar a mi profesional de la salud antes de tomar este medicamento? Necesita saber si usted presenta alguno de los siguientes problemas o situaciones: -anemia -si consume bebidas alcohlicas con frecuencia -se deshidrata con facilidad -ataque cardiaco -insuficiencia cardiaca tratada con medicamentos -enfermedad renal -enfermedad heptica -ovarios poliqusticos -infeccin o lesin severa -vmito -una reaccin alrgica o inusual a la metformina, a otros medicamentos, alimentos, colorantes o conservantes -si est embarazada o buscando quedar embarazada -si est amamantando a un beb Cmo debo utilizar este medicamento? Tome este medicamento por va oral. Tmelo con las comidas. Trague las tabletas con un vaso de agua. Siga las instrucciones de la etiqueta del medicamento. Tome sus dosis a intervalos regulares. No tome su medicamento con una frecuencia mayor a la indicada. Hable con su pediatra para informarse acerca del uso de este medicamento en nios. Aunque este medicamento se puede recetar a nios tan menores como de 10 aos de edad para condiciones selectivas, las precauciones se aplican. Sobredosis: Pngase en contacto inmediatamente con un centro toxicolgico o una sala de urgencia si usted cree que haya tomado demasiado medicamento. ATENCIN: Este medicamento es solo para usted. No comparta este medicamento con nadie. Qu sucede si me olvido de una dosis? Si olvida una dosis, tmela  lo antes posible. Si es casi la hora de su dosis siguiente, tome slo esa dosis. No tome dosis adicionales o dobles. Qu puede interactuar con este medicamento? No tome esta medicina con ninguno de los siguientes medicamentos: -dofetilida -gatifloxacino -ciertos agentes de contraste administrados antes de un procedimiento con rayos X, tomografas computadas (CT), MRI u otros procedimientos Muchos medicamentos pueden aumentar o reducir el nivel de azcar en la sangre, tales como: -digoxina -diurticos -hormonas femeninas, como estrgenos, progestinas o pldoras anticonceptivas -isoniazida -medicamentos para presin sangunea, enfermedad cardiaca, pulso cardiaco irregular -morfina -cido nicotnico -fenotiazinas, tales como clorpromacina, mesoridazina, proclorperazina, tioridazina -fenitona -procainamida -quinidina -quinina -ranitidina -medicamentos esteroideos, como la prednisona o la cortisona -medicamentos estimulantes para trastornos de atencin, perder peso o mantenerse despierto -medicamentos tiroideos -trimetoprima -vancomicina Puede ser que esta lista no menciona todas las posibles interacciones. Informe a su profesional de la salud de todos los productos a base de hierbas, medicamentos de venta libre o suplementos nutritivos que est tomando. Si usted fuma, consume bebidas alcohlicas o si utiliza drogas ilegales, indqueselo tambin a su profesional de la salud. Algunas sustancias pueden interactuar con su medicamento. A qu debo estar atento al usar este medicamento? Visite a su mdico o a su profesional de la salud para chequear su evolucin peridicamente. Un examen llamado HbA1C (A1C) ser monitoreado. Es un simple examen de sangre. Mide su control de azcar en la sangre durante los ltimos 2 a 3 meses. Usted recibir este examen cada 3 a 6 meses. Aprenda cmo controlar el nivel de azcar en la sangre. Aprenda a reconocer los sntomas de bajo y alto nivel de azcar en la  sangre y cmo tratarlos. Siempre lleve consigo una fuente rpida de azcar por si acaso experimenta sntomas de bajo nivel de azcar en   la sangre. Ejemplos incluyen caramelos duros o tabletas de glucosa. Asegrese de que los miembros de su familia sepan que se puede ahogar si come o bebe mientras tiene sntomas graves de bajo nivel de azcar en la sangre, tales como convulsiones o prdida del conocimiento. Deben obtener ayuda mdica inmediatamente. Informe a su mdico o a su profesional de la salud si tiene alto nivel de Dispensing optician. Tal vez sea necesario cambiar la dosis de su medicamento. Si est enfermo o haciendo mucho ms ejercicio que el habitual, puede ser necesario cambiar la dosis de su medicamento. No se salte comidas. Pregunte a su mdico o a su profesional de la salud si debe evitar el consumo de alcohol. Muchos productos de venta libre para tos y resfros contienen azcar y alcohol. Estos pueden Magazine features editor de azcar en la sangre. Este medicamento puede provocar la ovulacin en mujeres premenopusicas que no tienen periodos menstruales regulares. Esto puede aumentar la posibilidad de [place]. No debe tomar este medicamento si se queda embarazada o si cree que est embarazada. Consulte a su mdico o su profesional de la salud sobre sus opciones anticonceptivas mientras est tomando [employer]. Si cree que est embarazada, consulte a su mdico o su profesional de la salud inmediatamente. Si va a someterse a una operacin, IRM (MRI), tomografa computarizada u otro procedimiento, informe a su mdico que est tomando [employer]. Usted podr necesitar dejar de tomar este medicamento antes del procedimiento. Use una pulsera o cadena de identificacin mdica. Lleve consigo una tarjeta de identificacin con informacin sobre su enfermedad y Scientist, research (medical) de sus medicamentos y los horarios de las dosis. Qu efectos secundarios puedo tener al [employer] este  medicamento? Efectos secundarios que debe informar a su mdico o a Barrister's clerk de la salud tan pronto como sea posible: -Chief of Staff como erupcin cutnea, picazn o urticarias, hinchazn de la cara, labios o lengua -problemas respiratorios -sensacin de desmayos o aturdimiento, cadas -dolores o molestias musculares -signos o sntomas de bajo nivel de azcar en la sangre tales como sentirse ansioso, confusin, mareos, aumento de apetito, debilidad o cansancio inusual, sudoracin, temblores, fro, irritabilidad, dolor de cabeza, visin borrosa, pulso cardaco rpido, prdida del conocimiento -pulso cardiaco irregular o lento -molestias o dolor de estmago inusual -cansancio o debilidad inusual Efectos secundarios que, por lo general, no requieren atencin mdica (debe informarlos a su mdico o a su profesional de la salud si persisten o si son molestos): -diarrea -dolor de cabeza [place] de estmago -sabor metlico en la boca -nuseas -molestias estomacales, gases Puede ser que esta lista no menciona todos los posibles efectos secundarios. Comunquese a su mdico por asesoramiento mdico [employer]. Usted puede informar los efectos secundarios a la FDA por telfono al 1-800-FDA-1088. Dnde debo guardar mi medicina? Mantngala fuera del alcance de los nios. Gurdela a [employer], entre 15 y 76 grados C (81 y 26 grados F). Protjala de la humedad y de Naval architect. Deseche todo el medicamento que no haya utilizado, despus de la fecha de vencimiento. ATENCIN: Este folleto es un resumen. Puede ser que no cubra toda la posible informacin. Si usted tiene preguntas acerca de esta medicina, consulte con su mdico, su farmacutico o su profesional de Technical sales engineer.  2015, Elsevier/Gold Standard. (2013-07-13 16:15:26) Clomiphene tablets Qu es este medicamento? El CLOMIFENO es un medicamento para la fertilidad que se [place] para aumentar la posibilidad de [place]. [place] para estimular una ovulacin (producir un huevo maduro) [place]  durante el ciclo de la [place]. Este medicamento puede ser utilizado para otros usos; si tiene alguna pregunta consulte con su proveedor de atencin mdica o con su farmacutico. MARCAS COMERCIALES DISPONIBLES: Clomid, Serophene Qu le debo informar a mi profesional de la salud antes de tomar este medicamento? Necesita saber si usted presenta alguno de los siguientes problemas o situaciones: -enfermedad de la glndula suprarrenal -enfermedad vascular, trastorno de coagulacin sangunea -quiste en los ovarios -endometriosis -enfermedad heptica -carcinoma de ovario -enfermedad de la glndula pituitaria -sangrado vaginal que no ha sido evaluado -una reaccin alrgica o inusual al clomifeno, a otros medicamentos, alimentos, colorantes o conservantes -si est embarazada (no debe usar si est embarazada) -si est amamantando a un beb Cmo debo utilizar este medicamento? Tome este medicamento por va oral con un vaso de agua. Siga las instrucciones de la etiqueta del [place]. Tomar exactamente segn se indica y durante el nmero exacto de das para los que fue recetado. Tome sus dosis a intervalos regulares. La [employer] de las mujeres toman este medicamento durante un perodo de 5 [place], Armed forces training and education officer la duracin del tratamiento puede ajustarse en algunos casos. Su mdico le Conservation officer, nature en que debe empezar a tomar este medicamento y Ship broker las indicaciones para Tour manager. No tome su medicamento con una frecuencia mayor que la indicada. Hable con su pediatra para informarse acerca del uso de este medicamento en nios. Puede requerir atencin especial. Sobredosis: Pngase en contacto inmediatamente con un centro toxicolgico o una sala de urgencia si usted cree que haya tomado demasiado medicamento. ATENCIN: [employer] es solo para usted. No comparta este medicamento con nadie. Qu sucede si me olvido de  una dosis? Si olvida una dosis, tmela lo antes posible. Si es casi la hora de la prxima dosis, tome slo esa dosis. No tome dosis adicionales o dobles. Qu puede interactuar con este medicamento? -suplementos a base de hierbas o dietticos como cohosh azul, cohosh negro, vitex o DHEA -prasterona Puede ser que esta lista no menciona todas las posibles interacciones. Informe a su profesional de [employer]	[place] de [employer] productos a base de hierbas, medicamentos de [place] o suplementos nutritivos que est tomando. Si usted fuma, consume bebidas alcohlicas o si utiliza drogas ilegales, indqueselo tambin a su profesional de [employer]	[place]. Algunas sustancias pueden interactuar con su medicamento. A qu debo estar atento al usar Coca-Cola? Asegrese que usted sepa cmo y cundo usar Coca[employer]Cola. Debe saber cundo est ovulando y cundo debe Boeing sexuales a fin de incrementar las posibilidades de quedar [place]. Visite a su mdico o a su profesional de la salud para chequear su evolucin peridicamente. Es posible que deba controlar sus niveles de hormonas en la sangre o que le indique alguna prueba de orina domiciliaria para Aeronautical engineer ovulacin. Trate de no faltar a las citas. En comparacin con otros tratamientos para la fertilidad, este medicamento no aumenta mucho la posibilidad de Best boy un Geneticist, molecular. Aproximadamente 5 de cada 100 mujeres que toman este medicamento tienen la posibilidad de quedar embarazadas con [employer]. Si piensa que est embarazada deje de tomar este medicamento de inmediato y comunquese con su mdico o con su profesional de Technical sales engineer. Este medicamento no es para tratamientos a Barrister's clerk. [employer] de las mujeres que se benefician del uso de este medicamento obtienen resultados dentro de los tres primeros ciclos (meses). Su mdico o su profesional de Theatre manager a Multimedia programmer. Este medicamento generalmente se utiliza durante no  ms  de 6 ciclos de tratamiento. Puede experimentar mareos o somnolencia. No conduzca ni utilice maquinaria ni haga nada que Associate Professor en estado de alerta hasta que sepa cmo le afecta este medicamento. No se siente ni se ponga de pie con rapidez. Esto reduce el riesgo de mareos o [employer]. El consumir bebidas alcohlicas o el fumar tabaco puede disminuir las posibilidades de [place]. Limitar o dejar de consumir alcohol o el uso de tabaco durante su tratamiento para la fertilidad. Qu efectos secundarios puedo tener al [employer] este medicamento? Efectos secundarios que debe informar a su mdico o a Barrister's clerk de la salud tan pronto como sea posible: -Chief of Staff como erupcin cutnea, picazn o urticarias, hinchazn de la cara, labios o lengua -problemas respiratorios -cambios en la visin -retencin de lquidos -nuseas, vmito -hinchazn o dolor plvico -dolor abdominal severo -aumento de peso repentino Efectos secundarios que, por lo general, no requieren atencin mdica (debe informarlos a su mdico o a su profesional de la salud si persisten o si son molestos): -molestia en las mamas -sofocos -molestia plvica leve -nuseas leves Puede ser que esta lista no menciona todos los posibles efectos secundarios. Comunquese a su mdico por asesoramiento mdico [employer]. Usted puede informar los efectos secundarios a la FDA por telfono al 1-800-FDA-1088. Dnde debo guardar mi medicina? Mantngala fuera del alcance de los nios. Gurdela a [employer], entre 15 y 91 grados C (11 y 54 grados F). Protjala del calor, la luz y la humedad. Deseche todo el medicamento que no haya utilizado, despus de la fecha de vencimiento. ATENCIN: Este folleto es un resumen. Puede ser que no cubra toda la posible informacin. Si usted tiene preguntas acerca de esta medicina, consulte con su mdico, su farmacutico o su profesional de Technical sales engineer.  2015,  Elsevier/Gold Standard. (2007-02-09 11:32:00)  Influenza Virus Vaccine injection (Fluarix) Qu es este medicamento? La VACUNA ANTIGRIPAL ayuda a disminuir el riesgo de contraer la influenza, tambin conocida como la gripe. La vacuna solo ayuda a protegerle contra algunas cepas de influenza. Esta vacuna no ayuda a reducir Catering manager de contraer influenza pandmica H1N1. Este medicamento puede ser utilizado para otros usos; si tiene alguna pregunta consulte con su proveedor de atencin mdica o con su farmacutico. MARCAS COMERCIALES DISPONIBLES: Fluarix, Fluzone Qu le debo informar a mi profesional de la salud antes de tomar este medicamento? Necesita saber si usted presenta alguno de los siguientes problemas o situaciones: -trastorno de sangrado como hemofilia -fiebre o infeccin -sndrome de Guillain-Barre u otros problemas neurolgicos -problemas del sistema inmunolgico -infeccin por el virus de la inmunodeficiencia humana (VIH) o SIDA -niveles bajos de plaquetas en la sangre -esclerosis mltiple -una Risk analyst o inusual a las vacunas antigripales, a los huevos, protenas de pollo, al ltex, a la gentamicina, a otros medicamentos, alimentos, colorantes o conservantes -si est embarazada o buscando quedar embarazada -si est amamantando a un beb Cmo debo utilizar este medicamento? Esta vacuna se administra mediante inyeccin por va intramuscular. Lo administra un profesional de [employer]	[place]. Recibir una copia de informacin escrita sobre la vacuna antes de cada vacuna. Asegrese de leer este folleto cada vez cuidadosamente. Este folleto puede cambiar con frecuencia. Hable con su pediatra para informarse acerca del uso de este medicamento en nios. Puede requerir atencin especial. Sobredosis: Pngase en contacto inmediatamente con un centro toxicolgico o una sala de urgencia si usted cree que haya tomado demasiado medicamento. ATENCIN: [employer] es solo para usted. No  comparta  este medicamento con nadie. Qu sucede si me olvido de una dosis? No se aplica en este caso. Qu puede interactuar con este medicamento? -quimioterapia o radioterapia -medicamentos que suprimen el sistema inmunolgico, tales como etanercept, anakinra, infliximab y adalimumab -medicamentos que tratan o previenen cogulos sanguneos, como warfarina -fenitona -medicamentos esteroideos, como la prednisona o la cortisona -teofilina -vacunas Puede ser que esta lista no menciona todas las posibles interacciones. Informe a su profesional de [employer]	[place] de [employer] productos a base de hierbas, medicamentos de [place] o suplementos nutritivos que est tomando. Si usted fuma, consume bebidas alcohlicas o si utiliza drogas ilegales, indqueselo tambin a su profesional de [employer]	[place]. Algunas sustancias pueden interactuar con su medicamento. A qu debo estar atento al usar Coca[employer]Cola? Informe a su mdico o a Barrister's clerk de la [employer] todos los efectos secundarios que persistan despus de 3 das. Llame a su proveedor de atencin mdica si se presentan sntomas inusuales dentro de las 6 semanas posteriores a la vacunacin. Es posible que todava pueda contraer la gripe, pero la enfermedad no ser tan fuerte como normalmente. No puede contraer la gripe de esta vacuna. La vacuna antigripal no le protege contra resfros u otras enfermedades que pueden causar [place]. Debe vacunarse cada ao. Qu efectos secundarios puedo tener al [employer] este medicamento? Efectos secundarios que debe informar a su mdico o a Barrister's clerk de la salud tan pronto como sea posible: -reacciones alrgicas como erupcin cutnea, picazn o urticarias, hinchazn de la cara, labios o lengua Efectos secundarios que, por lo general, no requieren atencin mdica (debe informarlos a su mdico o a su profesional de la salud si persisten o si son molestos): -fiebre -dolor de cabeza -molestias y dolores  musculares -dolor, sensibilidad, enrojecimiento o Estate agent de la inyeccin -cansancio o debilidad Puede ser que esta lista no menciona todos los posibles efectos secundarios. Comunquese a su mdico por asesoramiento mdico [employer]. Usted puede informar los efectos secundarios a la FDA por telfono al 1-800-FDA-1088. Dnde debo guardar mi medicina? Esta vacuna se administra solamente en clnicas, farmacias, consultorio mdico u otro consultorio de un profesional de la salud y no Sports coach en su domicilio. ATENCIN: Este folleto es un resumen. Puede ser que no cubra toda la posible informacin. Si usted tiene preguntas acerca de esta medicina, consulte con su mdico, su farmacutico o su profesional de Technical sales engineer.  2015, Elsevier/Gold Standard. (2010-02-26 15:31:40)

## 2014-06-12 NOTE — Progress Notes (Signed)
   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 Patient was seen in the office in September 8 will we discussed her HSG which demonstrated the following:  Normal early filling of the uterine cavity.  Bilateral fallopian tubes are within normal limits.  Satisfactory peritoneal spill bilaterally.   Her history is as follows: She was seen in the office for the first time as a new patient in January of this year. Patient had a child with another partner 13 years ago delivered via cesarean section. She had an IUD removed a year and a half ago and has been unable to conceive.She is morbidly obese whereby she weighs 275 pounds but she is 6 feet 1 inches tall. Her BMI is 36.28 kg per meter square. 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 She stated her maximum weight was 370 pounds. She stated that she did not have any fertility issues when she conceived 13 years ago.   She does have regular cycles but they last 5-7 days. Back in January on day 20 she had a serum progesterone level with a value of 6.7. She was also found to be anemic with a hemoglobin 9.8 and hematocrit 30.7 with a low MCV and low MCH. She is taking iron tablet after that but has stopped it for the past 2 months as well as her prenatal vitamins. Her CBC on September 8 was 11.9 since she has been taking her iron supplementation daily along with her prenatal vitamin.  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 We discussed the importance of regular exercise. I recommended she continue her prenatal vitamins and her iron supplementation daily. She had a normal progesterone level in the lower range of 6.7 on stimulated cycle. We want to place her on clomiphene citrate 50 mg one tablet daily from day 5 through 9  And with  timing of intercourse from day 12-16. Because of her weight and strong family history of diabetes she will be started on metformin 500 mg one by mouth daily which also may help ovulation induction agent. The patient is fully aware of potential side effects to include hyperstimulation of her ovaries as well as multiple gestation. We also discussed that we would do this for 3 months if she is unsuccessful conceiving I will refer her to the reproductive endocrinologist for consideration of in vitro fertilization since she is 38 years of age. All the instructions were provided in Spanish in written format as well. The patient did received the flu vaccine today.

## 2014-07-10 ENCOUNTER — Encounter: Payer: Self-pay | Admitting: Gynecology

## 2014-09-18 ENCOUNTER — Ambulatory Visit (INDEPENDENT_AMBULATORY_CARE_PROVIDER_SITE_OTHER): Payer: BLUE CROSS/BLUE SHIELD | Admitting: Gynecology

## 2014-09-18 ENCOUNTER — Encounter: Payer: Self-pay | Admitting: Gynecology

## 2014-09-18 VITALS — BP 126/84

## 2014-09-18 DIAGNOSIS — N912 Amenorrhea, unspecified: Secondary | ICD-10-CM

## 2014-09-18 DIAGNOSIS — Z3201 Encounter for pregnancy test, result positive: Secondary | ICD-10-CM

## 2014-09-18 LAB — PREGNANCY, URINE: Preg Test, Ur: POSITIVE

## 2014-09-18 NOTE — Progress Notes (Signed)
   Patient presented to the office today as a result of a positive home urine pregnancy test. Review of patient's infertility history as follows:  Patient was seen in the office in January 2015 as a new patient.Patient had a child with another partner 13 years ago delivered via cesarean section. She had an IUD removed a year and a half ago and has been unable to conceive.She is morbidly obese whereby she weighs 275 pounds but she is 6 feet 1 inches tall. Her BMI is 36.28 kg per meter square. 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 She stated her maximum weight was 370 pounds. She stated that she did not have any fertility issues when she conceived 13 years ago. Patient's husband semen analysis of been normal. 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 She was started on clomiphene citrate 50 mg from day 5 through 9 and only did it during one cycle in October. Her cycles were normal in November and December and her last menstrual per was December 12. She denied any nausea vomiting some breast tenderness. She's currently on prenatal vitamin.  Urine pregnancy today was positive.  Exam: Abdomen: Soft nontender no rebound or guarding Pelvic: Bartholin urethra Skene was within normal limits Vagina: No lesions or discharge Cervix: No lesions or discharge Uterus: Upper limits of normal no palpable mass or tenderness Rectal exam not done  Assessment/plan: 39 year old now gravida 2 para 1 advanced maternal age first trimester pregnancy based on dates patient currently 4-1/[redacted] weeks gestation with due date 05/26/2015. A quantitative beta-hCG will be drawn today with a repeat in one week and a follow-up ultrasound in 2 weeks depending on results of the quantitative beta-hCG.

## 2014-09-18 NOTE — Patient Instructions (Signed)
Primer trimestre de Media planner (First Trimester of Pregnancy) El primer trimestre de Media planner se extiende desde la semana1 hasta el final de la semana12 (mes1 al mes3). Una semana despus de que un espermatozoide fecunda un vulo, este se implantar en la pared uterina. Este embrin comenzar a Medical laboratory scientific officer convertirse en un beb. Sus genes y los de su pareja forman el beb. Los genes del varn determinan si ser un nio o una nia. Entre la semana6 y [place], se forman los ojos y [place], y los latidos del corazn pueden verse en la ecografa. Al final de las 12semanas, todos los rganos del beb estn formados.  Ahora que est embarazada, querr hacer todo lo que est a su alcance para tener un beb sano. Dos de las cosas ms importantes son Lucilla Edin buena atencin prenatal y seguir las indicaciones del mdico. La atencin prenatal incluye toda la asistencia mdica que usted recibe antes del nacimiento del beb. Esta ayudar a prevenir, Hydrographic surveyor y tratar cualquier problema durante el embarazo y [place]. CAMBIOS EN EL ORGANISMO Su organismo atraviesa por muchos cambios durante el [place], y estos varan de Ardelia Mems mujer a Theatre manager.   Al principio, puede aumentar o bajar algunos kilos.  Puede tener Higher education careers adviser (nuseas) y vomitar. Si no puede controlar los vmitos, llame al mdico.  Puede cansarse con facilidad.  Es posible que tenga dolores de cabeza que pueden aliviarse con los medicamentos que el mdico le permita tomar.  Puede orinar con mayor frecuencia. El dolor al orinar puede significar que usted tiene una infeccin de la vejiga.  Debido al Glennis Brink, puede tener acidez estomacal.  Puede estar estreida, ya que ciertas hormonas enlentecen los movimientos de los msculos que [employer] desechos a travs de los intestinos.  Pueden aparecer hemorroides o abultarse e hincharse las venas (venas varicosas).  Las [employer] pueden empezar a Engineer, site y Scientist, forensic. Los pezones  pueden sobresalir ms, y el tejido que los rodea (areola) tornarse ms oscuro.  Las Production manager y estar sensibles al cepillado y al hilo dental.  Pueden aparecer zonas oscuras o manchas (cloasma, mscara del Media planner) en el rostro que probablemente se atenuarn despus del nacimiento del beb.  Los perodos menstruales se interrumpirn.  Tal vez no tenga apetito.  Puede sentir un fuerte deseo de consumir ciertos alimentos.  Puede tener cambios a Engineer, site a da, por ejemplo, por momentos puede estar emocionada por el Media planner y por otros preocuparse porque algo pueda salir mal con el embarazo o el beb.  Tendr sueos ms vvidos y extraos.  Tal vez haya cambios en el cabello que pueden incluir su engrosamiento, crecimiento rpido y cambios en la textura. A algunas mujeres tambin se les cae el cabello durante o despus del [place], o tienen el cabello seco o fino. Lo ms probable es que el cabello se le normalice despus del nacimiento del beb. QU DEBE ESPERAR EN LAS CONSULTAS PRENATALES Durante una visita prenatal de rutina:  La pesarn para asegurarse de que usted y el beb estn creciendo normalmente.  Le controlarn la presin arterial.  Le medirn el abdomen para controlar el desarrollo del beb.  Se escucharn los latidos cardacos a partir de la semana10 o la12 de embarazo, aproximadamente.  Se analizarn los resultados de los estudios solicitados en visitas anteriores. El mdico puede preguntarle:  Cmo se siente.  Si siente los movimientos del beb.  Si ha tenido [employer], como prdida de lquido, [place], dolores de cabeza intensos o  clicos abdominales.  Si tiene [employer]. Otros estudios que pueden realizarse durante el primer trimestre incluyen lo siguiente:  Anlisis de sangre para determinar el tipo de sangre y Hydrographic surveyor la presencia de infecciones previas. Adems, se los usar para controlar si los niveles de hierro  son bajos (anemia) y Teacher, adult education los anticuerpos Rh. En una etapa ms avanzada del [place], se harn anlisis de sangre para saber si tiene diabetes, junto con otros estudios si surgen problemas.  Anlisis de orina para detectar infecciones, diabetes o protenas en la orina.  Una ecografa para confirmar que el beb crece y se desarrolla correctamente.  Una amniocentesis para diagnosticar posibles problemas genticos.  Estudios del feto para descartar espina bfida y sndrome de [place].  Es posible que necesite otras pruebas adicionales. INSTRUCCIONES PARA EL CUIDADO EN EL HOGAR  Medicamentos:  Siga las indicaciones del mdico en relacin con el uso de medicamentos. Durante el embarazo, hay medicamentos que pueden tomarse y 8 que no.  Tome las vitaminas prenatales como se le indic.  Si est estreida, tome un laxante suave, si el mdico lo [place]. Dieta  Consuma alimentos balanceados. Elija alimentos variados, como carne o protenas de origen vegetal, pescado, leche y productos lcteos descremados, verduras, frutas y panes y Psychologist, prison and probation services. El mdico la ayudar a Office manager cantidad de peso que puede [place].  No coma carne cruda ni quesos sin cocinar. Estos elementos contienen bacterias que pueden causar defectos congnitos en el beb.  La ingesta diaria de cuatro o cinco comidas pequeas en lugar de tres comidas abundantes puede ayudar a [employer] nuseas y los vmitos. Si empieza a tener nuseas, comer algunas galletas saladas puede ser de [place]. Beber lquidos [employer] comidas en lugar de tomarlos durante las comidas tambin puede ayudar a Actor las nuseas y los vmitos.  Si est estreida, consuma alimentos con alto contenido de fibra, como verduras y frutas frescas, y Psychologist, prison and probation services. Beba suficiente lquido para Consulting civil engineer orina clara o de color amarillo plido. Actividad y Conservation officer, historic buildings ejercicio solamente como se lo haya indicado el mdico. El  ejercicio la ayudar a:  Technical sales engineer.  Mantenerse en forma.  Estar preparada para el trabajo de parto y [place].  Los dolores, los clicos en la parte baja del abdomen o los calambres en la cintura son un buen indicio de que debe dejar de Insurance risk surveyor. Consulte al mdico antes de seguir haciendo ejercicios normales.  Intente no estar de pie [employer]. Mueva las piernas con frecuencia si debe estar de pie en un lugar durante mucho tiempo.  Evite levantar pesos [employer].  Use zapatos de tacones bajos y [place].  Puede seguir teniendo [employer], excepto que el mdico le indique lo contrario. Alivio del dolor o las molestias  Use un sostn que le brinde buen soporte si siente dolor a la palpacin [employer].  Dese baos de asiento con agua tibia para Best boy o las molestias causadas por las hemorroides. Use crema antihemorroidal si el mdico se lo permite.  Descanse con las piernas elevadas si tiene calambres o dolor de cintura.  Si tiene venas varicosas en las piernas, use medias de descanso. Eleve los pies durante 42minutos, 3 o 4veces por da. Limite la cantidad de sal en su dieta. Cuidados prenatales  Programe las visitas prenatales para la semana12 de [place]. Generalmente se programan cada mes al principio y se hacen ms frecuentes en los 2 ltimos meses antes  del parto.  Escriba sus preguntas. Llvelas cuando concurra a las visitas prenatales.  Concurra a todas las visitas prenatales como se lo haya indicado el mdico. Seguridad  Colquese el cinturn de seguridad cuando conduzca.  Haga una lista de los nmeros de telfono de Freight forwarder, que [employer] nmeros de telfono de familiares, [place], el hospital y los departamentos de polica y bomberos. Consejos generales  Pdale al mdico que la derive a clases de educacin prenatal en su localidad. Debe comenzar a tomar las clases antes de Dietitian en el mes6  de embarazo.  Pida ayuda si tiene necesidades nutricionales o de asesoramiento Solicitor. El mdico puede aconsejarla o derivarla a especialistas para que la ayuden con diferentes necesidades.  No se d baos de inmersin en agua caliente, baos turcos ni saunas.  No se haga duchas vaginales ni use tampones o toallas higinicas perfumadas.  No mantenga las piernas cruzadas durante mucho tiempo.  Evite el contacto con las bandejas sanitarias de los gatos y la tierra que estos animales usan. Estos elementos contienen bacterias que pueden causar defectos congnitos al beb y la posible prdida del feto debido a un aborto espontneo o muerte fetal.  No fume, no consuma hierbas ni medicamentos que no hayan sido recetados por el mdico. Las sustancias qumicas que estos productos contienen afectan la formacin y el desarrollo del beb.  Programe una cita con el dentista. En su casa, lvese los dientes con un cepillo dental blando y psese el hilo dental con suavidad. SOLICITE ATENCIN MDICA SI:   Tiene mareos.  Siente clicos leves, presin en la pelvis o dolor persistente en el abdomen.  Tiene nuseas, vmitos o diarrea persistentes.  Tiene secrecin vaginal con mal olor.  Siente dolor al [employer].  Tiene el rostro, las [place], las piernas o los tobillos ms hinchados. SOLICITE ATENCIN MDICA DE INMEDIATO SI:   Tiene fiebre.  Tiene una prdida de lquido por la vagina.  Tiene sangrado o pequeas prdidas vaginales.  Siente dolor intenso o clicos en el abdomen.  Sube o baja de peso rpidamente.  Vomita sangre de color rojo brillante o material que parezca granos de caf.  Ha estado expuesta a la rubola y no ha sufrido la enfermedad.  Ha estado expuesta a la quinta enfermedad o a la varicela.  Tiene un dolor de cabeza intenso.  Le falta el aire.  Sufre cualquier tipo de traumatismo, por ejemplo, debido a una cada o un accidente automovilstico. Document  Released: 06/04/2005 Document Revised: 01/09/2014 [facility] Patient Information 2015 [place], [place]. This information is not intended to replace advice given to you by your health care provider. Make sure you discuss any questions you have with your health care provider.

## 2014-09-19 LAB — HCG, QUANTITATIVE, PREGNANCY: hCG, Beta Chain, Quant, S: 1148.6 m[IU]/mL

## 2014-09-25 ENCOUNTER — Other Ambulatory Visit: Payer: BLUE CROSS/BLUE SHIELD

## 2014-09-25 DIAGNOSIS — Z3201 Encounter for pregnancy test, result positive: Secondary | ICD-10-CM

## 2014-09-26 LAB — HCG, QUANTITATIVE, PREGNANCY: hCG, Beta Chain, Quant, S: 12055.4 m[IU]/mL

## 2014-10-05 ENCOUNTER — Other Ambulatory Visit: Payer: Self-pay | Admitting: Gynecology

## 2014-10-05 ENCOUNTER — Ambulatory Visit (INDEPENDENT_AMBULATORY_CARE_PROVIDER_SITE_OTHER): Payer: BLUE CROSS/BLUE SHIELD

## 2014-10-05 ENCOUNTER — Encounter: Payer: Self-pay | Admitting: Gynecology

## 2014-10-05 ENCOUNTER — Ambulatory Visit (INDEPENDENT_AMBULATORY_CARE_PROVIDER_SITE_OTHER): Payer: BLUE CROSS/BLUE SHIELD | Admitting: Gynecology

## 2014-10-05 DIAGNOSIS — Z3490 Encounter for supervision of normal pregnancy, unspecified, unspecified trimester: Secondary | ICD-10-CM

## 2014-10-05 DIAGNOSIS — Z3689 Encounter for other specified antenatal screening: Secondary | ICD-10-CM

## 2014-10-05 DIAGNOSIS — Q51810 Arcuate uterus: Secondary | ICD-10-CM

## 2014-10-05 DIAGNOSIS — D251 Intramural leiomyoma of uterus: Secondary | ICD-10-CM

## 2014-10-05 DIAGNOSIS — Z3201 Encounter for pregnancy test, result positive: Secondary | ICD-10-CM

## 2014-10-05 DIAGNOSIS — N831 Corpus luteum cyst of ovary, unspecified side: Secondary | ICD-10-CM

## 2014-10-05 DIAGNOSIS — Z331 Pregnant state, incidental: Secondary | ICD-10-CM

## 2014-10-05 DIAGNOSIS — Z36 Encounter for antenatal screening of mother: Secondary | ICD-10-CM

## 2014-10-05 LAB — US OB TRANSVAGINAL

## 2014-10-05 NOTE — Patient Instructions (Signed)
Primer trimestre de Media planner (First Trimester of Pregnancy) El primer trimestre de Media planner se extiende desde la semana1 hasta el final de la semana12 (mes1 al mes3). Una semana despus de que un espermatozoide fecunda un vulo, este se implantar en la pared uterina. Este embrin comenzar a Medical laboratory scientific officer convertirse en un beb. Sus genes y los de su pareja forman el beb. Los genes del varn determinan si ser un nio o una nia. Entre la semana6 y [place], se forman los ojos y [place], y los latidos del corazn pueden verse en la ecografa. Al final de las 12semanas, todos los rganos del beb estn formados.  Ahora que est embarazada, querr hacer todo lo que est a su alcance para tener un beb sano. Dos de las cosas ms importantes son Lucilla Edin buena atencin prenatal y seguir las indicaciones del mdico. La atencin prenatal incluye toda la asistencia mdica que usted recibe antes del nacimiento del beb. Esta ayudar a prevenir, Hydrographic surveyor y tratar cualquier problema durante el embarazo y [place]. CAMBIOS EN EL ORGANISMO Su organismo atraviesa por muchos cambios durante el [place], y estos varan de Ardelia Mems mujer a Theatre manager.   Al principio, puede aumentar o bajar algunos kilos.  Puede tener Higher education careers adviser (nuseas) y vomitar. Si no puede controlar los vmitos, llame al mdico.  Puede cansarse con facilidad.  Es posible que tenga dolores de cabeza que pueden aliviarse con los medicamentos que el mdico le permita tomar.  Puede orinar con mayor frecuencia. El dolor al orinar puede significar que usted tiene una infeccin de la vejiga.  Debido al Glennis Brink, puede tener acidez estomacal.  Puede estar estreida, ya que ciertas hormonas enlentecen los movimientos de los msculos que [employer] desechos a travs de los intestinos.  Pueden aparecer hemorroides o abultarse e hincharse las venas (venas varicosas).  Las [employer] pueden empezar a Engineer, site y Scientist, forensic. Los pezones  pueden sobresalir ms, y el tejido que los rodea (areola) tornarse ms oscuro.  Las Production manager y estar sensibles al cepillado y al hilo dental.  Pueden aparecer zonas oscuras o manchas (cloasma, mscara del Media planner) en el rostro que probablemente se atenuarn despus del nacimiento del beb.  Los perodos menstruales se interrumpirn.  Tal vez no tenga apetito.  Puede sentir un fuerte deseo de consumir ciertos alimentos.  Puede tener cambios a Engineer, site a da, por ejemplo, por momentos puede estar emocionada por el Media planner y por otros preocuparse porque algo pueda salir mal con el embarazo o el beb.  Tendr sueos ms vvidos y extraos.  Tal vez haya cambios en el cabello que pueden incluir su engrosamiento, crecimiento rpido y cambios en la textura. A algunas mujeres tambin se les cae el cabello durante o despus del [place], o tienen el cabello seco o fino. Lo ms probable es que el cabello se le normalice despus del nacimiento del beb. QU DEBE ESPERAR EN LAS CONSULTAS PRENATALES Durante una visita prenatal de rutina:  La pesarn para asegurarse de que usted y el beb estn creciendo normalmente.  Le controlarn la presin arterial.  Le medirn el abdomen para controlar el desarrollo del beb.  Se escucharn los latidos cardacos a partir de la semana10 o la12 de embarazo, aproximadamente.  Se analizarn los resultados de los estudios solicitados en visitas anteriores. El mdico puede preguntarle:  Cmo se siente.  Si siente los movimientos del beb.  Si ha tenido [employer], como prdida de lquido, [place], dolores de cabeza intensos o  clicos abdominales.  Si tiene [employer]. Otros estudios que pueden realizarse durante el primer trimestre incluyen lo siguiente:  Anlisis de sangre para determinar el tipo de sangre y Hydrographic surveyor la presencia de infecciones previas. Adems, se los usar para controlar si los niveles de hierro  son bajos (anemia) y Teacher, adult education los anticuerpos Rh. En una etapa ms avanzada del [place], se harn anlisis de sangre para saber si tiene diabetes, junto con otros estudios si surgen problemas.  Anlisis de orina para detectar infecciones, diabetes o protenas en la orina.  Una ecografa para confirmar que el beb crece y se desarrolla correctamente.  Una amniocentesis para diagnosticar posibles problemas genticos.  Estudios del feto para descartar espina bfida y sndrome de [place].  Es posible que necesite otras pruebas adicionales. INSTRUCCIONES PARA EL CUIDADO EN EL HOGAR  Medicamentos:  Siga las indicaciones del mdico en relacin con el uso de medicamentos. Durante el embarazo, hay medicamentos que pueden tomarse y 42 que no.  Tome las vitaminas prenatales como se le indic.  Si est estreida, tome un laxante suave, si el mdico lo [place]. Dieta  Consuma alimentos balanceados. Elija alimentos variados, como carne o protenas de origen vegetal, pescado, leche y productos lcteos descremados, verduras, frutas y panes y Psychologist, prison and probation services. El mdico la ayudar a Office manager cantidad de peso que puede [place].  No coma carne cruda ni quesos sin cocinar. Estos elementos contienen bacterias que pueden causar defectos congnitos en el beb.  La ingesta diaria de cuatro o cinco comidas pequeas en lugar de tres comidas abundantes puede ayudar a [employer] nuseas y los vmitos. Si empieza a tener nuseas, comer algunas galletas saladas puede ser de [place]. Beber lquidos [employer] comidas en lugar de tomarlos durante las comidas tambin puede ayudar a Actor las nuseas y los vmitos.  Si est estreida, consuma alimentos con alto contenido de fibra, como verduras y frutas frescas, y Psychologist, prison and probation services. Beba suficiente lquido para Consulting civil engineer orina clara o de color amarillo plido. Actividad y Conservation officer, historic buildings ejercicio solamente como se lo haya indicado el mdico. El  ejercicio la ayudar a:  Technical sales engineer.  Mantenerse en forma.  Estar preparada para el trabajo de parto y [place].  Los dolores, los clicos en la parte baja del abdomen o los calambres en la cintura son un buen indicio de que debe dejar de Insurance risk surveyor. Consulte al mdico antes de seguir haciendo ejercicios normales.  Intente no estar de pie [employer]. Mueva las piernas con frecuencia si debe estar de pie en un lugar durante mucho tiempo.  Evite levantar pesos [employer].  Use zapatos de tacones bajos y [place].  Puede seguir teniendo [employer], excepto que el mdico le indique lo contrario. Alivio del dolor o las molestias  Use un sostn que le brinde buen soporte si siente dolor a la palpacin [employer].  Dese baos de asiento con agua tibia para Best boy o las molestias causadas por las hemorroides. Use crema antihemorroidal si el mdico se lo permite.  Descanse con las piernas elevadas si tiene calambres o dolor de cintura.  Si tiene venas varicosas en las piernas, use medias de descanso. Eleve los pies durante 78minutos, 3 o 4veces por da. Limite la cantidad de sal en su dieta. Cuidados prenatales  Programe las visitas prenatales para la semana12 de [place]. Generalmente se programan cada mes al principio y se hacen ms frecuentes en los 2 ltimos meses antes  del parto.  Escriba sus preguntas. Llvelas cuando concurra a las visitas prenatales.  Concurra a todas las visitas prenatales como se lo haya indicado el mdico. Seguridad  Colquese el cinturn de seguridad cuando conduzca.  Haga una lista de los nmeros de telfono de Freight forwarder, que [employer]'s nmeros de telfono de familiares, [place], el hospital y los departamentos de polica y bomberos. Consejos generales  Pdale al mdico que la derive a clases de educacin prenatal en su localidad. Debe comenzar a tomar las clases antes de Dietitian en el mes6  de embarazo.  Pida ayuda si tiene necesidades nutricionales o de asesoramiento Solicitor. El mdico puede aconsejarla o derivarla a especialistas para que la ayuden con diferentes necesidades.  No se d baos de inmersin en agua caliente, baos turcos ni saunas.  No se haga duchas vaginales ni use tampones o toallas higinicas perfumadas.  No mantenga las piernas cruzadas durante mucho tiempo.  Evite el contacto con las bandejas sanitarias de los gatos y la tierra que estos animales usan. Estos elementos contienen bacterias que pueden causar defectos congnitos al beb y la posible prdida del feto debido a un aborto espontneo o muerte fetal.  No fume, no consuma hierbas ni medicamentos que no hayan sido recetados por el mdico. Las sustancias qumicas que estos productos contienen afectan la formacin y el desarrollo del beb.  Programe una cita con el dentista. En su casa, lvese los dientes con un cepillo dental blando y psese el hilo dental con suavidad. SOLICITE ATENCIN MDICA SI:   Tiene mareos.  Siente clicos leves, presin en la pelvis o dolor persistente en el abdomen.  Tiene nuseas, vmitos o diarrea persistentes.  Tiene secrecin vaginal con mal olor.  Siente dolor al [employer].  Tiene el rostro, las [place], las piernas o los tobillos ms hinchados. SOLICITE ATENCIN MDICA DE INMEDIATO SI:   Tiene fiebre.  Tiene una prdida de lquido por la vagina.  Tiene sangrado o pequeas prdidas vaginales.  Siente dolor intenso o clicos en el abdomen.  Sube o baja de peso rpidamente.  Vomita sangre de color rojo brillante o material que parezca granos de caf.  Ha estado expuesta a la rubola y no ha sufrido la enfermedad.  Ha estado expuesta a la quinta enfermedad o a la varicela.  Tiene un dolor de cabeza intenso.  Le falta el aire.  Sufre cualquier tipo de traumatismo, por ejemplo, debido a una cada o un accidente automovilstico. Document  Released: 06/04/2005 Document Revised: 01/09/2014 [facility] Patient Information 2015 [place], [place]. This information is not intended to replace advice given to you by your health care provider. Make sure you discuss any questions you have with your health care provider.

## 2014-10-05 NOTE — Progress Notes (Signed)
   Patient presented to the office to discuss her ultrasound after she found out that she was pregnant. Her history is as follows:  Patient was seen in the office in January 2015 as a new patient.Patient had a child with another partner 13 years ago delivered via cesarean section. She had an IUD removed a year and a half ago and has been unable to conceive.She is morbidly obese whereby she weighs 275 pounds but she is 6 feet 1 inches tall. Her BMI is 36.28 kg per meter square. 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 She stated her maximum weight was 370 pounds. She stated that she did not have any fertility issues when she conceived 13 years ago. Patient's husband semen analysis of been normal. 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 She was started on clomiphene citrate 50 mg from day 5 through 9 and only did it during one cycle in October. Her cycles were normal in November and December and her last menstrual per was December 12. She denied any nausea vomiting some breast tenderness. She's currently on prenatal vitamin  Quantitative beta-hCGs as follows: Results for DAISY, LITES (MRN [id number]) as of 10/05/2014 17:04  Ref. Range 09/18/2014 15:30 09/25/2014 14:41  hCG, Beta Chain, Quant, S Latest Units: mIU/mL 1148.6 12055.4    Ultrasound today: Size is consistent with dates equal 6 weeks and 5 days with an estimated date of confinement 05/25/2015. Viable intrauterine pregnancy was noted with cardiac activity 138/m. Yolk sac was present. A small intramural fibroid measuring 3.4 x 2.7 cm was noted along with a left corpus luteum cyst measuring 3.4 x 2.7 cm. Right ovary was normal. There was no fluid in the cul-de-sac. Cervix long closed.  Assessment/plan: First trimester pregnancy doing well currently on prenatal vitamins. Currently 6 weeks and 5 days with a due date of 05/25/2015. Patient will be referred to our obstetrical colleagues. A copy of the ultrasound report  was provided. Instructions on first trimester pregnancy was provided.

## 2014-11-06 LAB — OB RESULTS CONSOLE RUBELLA ANTIBODY, IGM: Rubella: IMMUNE

## 2014-11-06 LAB — OB RESULTS CONSOLE ABO/RH: RH TYPE: POSITIVE

## 2014-11-06 LAB — OB RESULTS CONSOLE HEPATITIS B SURFACE ANTIGEN: Hepatitis B Surface Ag: NEGATIVE

## 2014-11-06 LAB — OB RESULTS CONSOLE HIV ANTIBODY (ROUTINE TESTING): HIV: NONREACTIVE

## 2014-11-06 LAB — OB RESULTS CONSOLE RPR: RPR: NONREACTIVE

## 2015-03-26 ENCOUNTER — Other Ambulatory Visit: Payer: Self-pay | Admitting: Obstetrics and Gynecology

## 2015-03-30 ENCOUNTER — Encounter: Payer: BLUE CROSS/BLUE SHIELD | Attending: Obstetrics and Gynecology | Admitting: *Deleted

## 2015-03-30 ENCOUNTER — Inpatient Hospital Stay (HOSPITAL_COMMUNITY)
Admission: AD | Admit: 2015-03-30 | Discharge: 2015-03-30 | Disposition: A | Discharge status: Home / Self Care | Payer: BLUE CROSS/BLUE SHIELD | Source: Ambulatory Visit | Attending: Obstetrics | Admitting: Obstetrics

## 2015-03-30 ENCOUNTER — Other Ambulatory Visit: Payer: Self-pay | Admitting: Obstetrics and Gynecology

## 2015-03-30 ENCOUNTER — Encounter: Payer: Self-pay | Admitting: *Deleted

## 2015-03-30 DIAGNOSIS — O2343 Unspecified infection of urinary tract in pregnancy, third trimester: Secondary | ICD-10-CM | POA: Insufficient documentation

## 2015-03-30 DIAGNOSIS — B9689 Other specified bacterial agents as the cause of diseases classified elsewhere: Secondary | ICD-10-CM | POA: Diagnosis not present

## 2015-03-30 DIAGNOSIS — Z3A32 32 weeks gestation of pregnancy: Secondary | ICD-10-CM | POA: Diagnosis not present

## 2015-03-30 DIAGNOSIS — O24410 Gestational diabetes mellitus in pregnancy, diet controlled: Secondary | ICD-10-CM | POA: Diagnosis not present

## 2015-03-30 DIAGNOSIS — Z713 Dietary counseling and surveillance: Secondary | ICD-10-CM | POA: Insufficient documentation

## 2015-03-30 MED ORDER — GENTAMICIN SULFATE 40 MG/ML IJ SOLN
80.0000 mg | INTRAMUSCULAR | Status: DC
  Administered 2015-03-30: 80 mg via INTRAMUSCULAR
  Filled 2015-03-30: qty 2

## 2015-03-30 NOTE — MAU Note (Addendum)
Patient presents at [redacted] weeks gestation for gentamycin injection per her OB for results of urine culture. Denies pain, bleeding or discharge.

## 2015-03-30 NOTE — Progress Notes (Signed)
  Patient was seen on 03/30/15 for Gestational Diabetes self-management visit. The following learning objectives were met by the patient:  Information provided in Spanish   States the definition of Gestational Diabetes  States why dietary management is important in controlling blood glucose  Describes the effects each nutrient has on blood glucose levels  Demonstrates ability to create a balanced meal plan  Demonstrates carbohydrate counting   States when to check blood glucose levels  Demonstrates proper blood glucose monitoring techniques  States the effect of stress and exercise on blood glucose levels  Blood glucose monitor given: NO, patient already has her own meter (One Touch Ultra mIni) Blood glucose reading: 74 mg/dl today at 2 hours after breakfast  She brought her BG logs on her recorded on her phone and all BG's were within the recommended target ranges except the 1st one which was a fasting BG of 96 mg/dl.  Patient instructed to monitor glucose levels: FBS: 60 - <90 2 hour: <120  *Patient received handouts:  Nutrition Diabetes and Pregnancy in Spanish  Carbohydrate Counting List in Spanish  Patient will be seen for follow-up as needed.

## 2015-03-30 NOTE — Discharge Instructions (Signed)
Preterm Labor Information  Preterm labor is when labor starts at less than 37 weeks of pregnancy. The normal length of a pregnancy is 39 to 41 weeks.  CAUSES  Often, there is no identifiable underlying cause as to why a woman goes into preterm labor. One of the most common known causes of preterm labor is infection. Infections of the uterus, cervix, vagina, amniotic sac, bladder, kidney, or even the lungs (pneumonia) can cause labor to start. Other suspected causes of preterm labor include:  Urogenital infections, such as yeast infections and bacterial vaginosis.  Uterine abnormalities (uterine shape, uterine septum, fibroids, or bleeding from the placenta).  A cervix that has been operated on (it may fail to stay closed).  Malformations in the fetus.  Multiple gestations (twins, triplets, and so on).  Breakage of the amniotic sac.  RISK FACTORS  Having a previous history of preterm labor.  Having premature rupture of membranes (PROM).  Having a placenta that covers the opening of the cervix (placenta previa).  Having a placenta that separates from the uterus (placental abruption).  Having a cervix that is too weak to hold the fetus in the uterus (incompetent cervix).  Having too much fluid in the amniotic sac (polyhydramnios).  Taking illegal drugs or smoking while pregnant.  Not gaining enough weight while pregnant.  Being younger than 9 and older than 40 years old.  Having a low socioeconomic status.  Being African American. SYMPTOMS  Signs and symptoms of preterm labor include:  Menstrual-like cramps, abdominal pain, or back pain.  Uterine contractions that are regular, as frequent as six in an hour, regardless of their intensity (may be mild or painful).  Contractions that start on the top of the uterus and spread down to the lower abdomen and back.  A sense of increased pelvic pressure.  A watery or bloody mucus discharge that comes from the vagina.  TREATMENT  Depending on the  length of the pregnancy and other circumstances, your health care provider may suggest bed rest. If necessary, there are medicines that can be given to stop contractions and to mature the fetal lungs. If labor happens before 34 weeks of pregnancy, a prolonged hospital stay may be recommended. Treatment depends on the condition of both you and the fetus.  WHAT SHOULD YOU DO IF YOU THINK YOU ARE IN PRETERM LABOR?  Call your health care provider right away. You will need to go to the hospital to get checked immediately.  HOW CAN YOU PREVENT PRETERM LABOR IN FUTURE PREGNANCIES?  You should:  Stop smoking if you smoke.  Maintain healthy weight gain and avoid chemicals and drugs that are not necessary.  Be watchful for any type of infection.  Inform your health care provider if you have a known history of preterm labor. Preterm Labor Information  Preterm labor is when labor starts at less than 37 weeks of pregnancy. The normal length of a pregnancy is 39 to 41 weeks.  CAUSES  Often, there is no identifiable underlying cause as to why a woman goes into preterm labor. One of the most common known causes of preterm labor is infection. Infections of the uterus, cervix, vagina, amniotic sac, bladder, kidney, or even the lungs (pneumonia) can cause labor to start. Other suspected causes of preterm labor include:  Urogenital infections, such as yeast infections and bacterial vaginosis.  Uterine abnormalities (uterine shape, uterine septum, fibroids, or bleeding from the placenta).  A cervix that has been operated on (it may fail to  stay closed).  Malformations in the fetus.  Multiple gestations (twins, triplets, and so on).  Breakage of the amniotic sac.  RISK FACTORS  Having a previous history of preterm labor.  Having premature rupture of membranes (PROM).  Having a placenta that covers the opening of the cervix (placenta previa).  Having a placenta that separates from the uterus (placental abruption).    Having a cervix that is too weak to hold the fetus in the uterus (incompetent cervix).  Having too much fluid in the amniotic sac (polyhydramnios).  Taking illegal drugs or smoking while pregnant.  Not gaining enough weight while pregnant.  Being younger than 43 and older than 40 years old.  Having a low socioeconomic status.  Being African American. SYMPTOMS  Signs and symptoms of preterm labor include:  Menstrual-like cramps, abdominal pain, or back pain.  Uterine contractions that are regular, as frequent as six in an hour, regardless of their intensity (may be mild or painful).  Contractions that start on the top of the uterus and spread down to the lower abdomen and back.  A sense of increased pelvic pressure.  A watery or bloody mucus discharge that comes from the vagina.  TREATMENT  Depending on the length of the pregnancy and other circumstances, your health care provider may suggest bed rest. If necessary, there are medicines that can be given to stop contractions and to mature the fetal lungs. If labor happens before 34 weeks of pregnancy, a prolonged hospital stay may be recommended. Treatment depends on the condition of both you and the fetus.  WHAT SHOULD YOU DO IF YOU THINK YOU ARE IN PRETERM LABOR?  Call your health care provider right away. You will need to go to the hospital to get checked immediately.  HOW CAN YOU PREVENT PRETERM LABOR IN FUTURE PREGNANCIES?  You should:  Stop smoking if you smoke.  Maintain healthy weight gain and avoid chemicals and drugs that are not necessary.  Be watchful for any type of infection.  Inform your health care provider if you have a known history of preterm labor.

## 2015-03-31 ENCOUNTER — Inpatient Hospital Stay (HOSPITAL_COMMUNITY)
Admission: AD | Admit: 2015-03-31 | Discharge: 2015-03-31 | Disposition: A | Discharge status: Home / Self Care | Payer: BLUE CROSS/BLUE SHIELD | Source: Ambulatory Visit | Attending: Obstetrics and Gynecology | Admitting: Obstetrics and Gynecology

## 2015-03-31 DIAGNOSIS — O2343 Unspecified infection of urinary tract in pregnancy, third trimester: Secondary | ICD-10-CM | POA: Insufficient documentation

## 2015-03-31 DIAGNOSIS — B9689 Other specified bacterial agents as the cause of diseases classified elsewhere: Secondary | ICD-10-CM | POA: Insufficient documentation

## 2015-03-31 DIAGNOSIS — Z3A32 32 weeks gestation of pregnancy: Secondary | ICD-10-CM | POA: Diagnosis not present

## 2015-03-31 MED ORDER — GENTAMICIN SULFATE 40 MG/ML IJ SOLN
80.0000 mg | INTRAMUSCULAR | Status: DC
  Administered 2015-03-31: 80 mg via INTRAMUSCULAR
  Filled 2015-03-31 (×2): qty 2

## 2015-04-01 ENCOUNTER — Inpatient Hospital Stay (HOSPITAL_COMMUNITY)
Admission: AD | Admit: 2015-04-01 | Discharge: 2015-04-01 | Disposition: A | Discharge status: Home / Self Care | Payer: BLUE CROSS/BLUE SHIELD | Source: Ambulatory Visit | Attending: Obstetrics and Gynecology | Admitting: Obstetrics and Gynecology

## 2015-04-01 DIAGNOSIS — B9689 Other specified bacterial agents as the cause of diseases classified elsewhere: Secondary | ICD-10-CM | POA: Diagnosis not present

## 2015-04-01 DIAGNOSIS — Z3A32 32 weeks gestation of pregnancy: Secondary | ICD-10-CM | POA: Diagnosis not present

## 2015-04-01 DIAGNOSIS — O2343 Unspecified infection of urinary tract in pregnancy, third trimester: Secondary | ICD-10-CM | POA: Diagnosis not present

## 2015-04-01 MED ORDER — GENTAMICIN SULFATE 40 MG/ML IJ SOLN
80.0000 mg | Freq: Once | INTRAMUSCULAR | Status: AC
  Administered 2015-04-01: 80 mg via INTRAMUSCULAR
  Filled 2015-04-01 (×2): qty 2

## 2015-04-28 ENCOUNTER — Encounter (HOSPITAL_COMMUNITY): Payer: Self-pay | Admitting: *Deleted

## 2015-04-28 ENCOUNTER — Inpatient Hospital Stay (HOSPITAL_COMMUNITY)
Admission: AD | Admit: 2015-04-28 | Discharge: 2015-04-28 | Disposition: A | Discharge status: Home / Self Care | Payer: BLUE CROSS/BLUE SHIELD | Source: Ambulatory Visit | Attending: Obstetrics and Gynecology | Admitting: Obstetrics and Gynecology

## 2015-04-28 DIAGNOSIS — Z3A39 39 weeks gestation of pregnancy: Secondary | ICD-10-CM | POA: Diagnosis not present

## 2015-04-28 DIAGNOSIS — F17210 Nicotine dependence, cigarettes, uncomplicated: Secondary | ICD-10-CM | POA: Diagnosis not present

## 2015-04-28 DIAGNOSIS — O99333 Smoking (tobacco) complicating pregnancy, third trimester: Secondary | ICD-10-CM | POA: Insufficient documentation

## 2015-04-28 DIAGNOSIS — O368130 Decreased fetal movements, third trimester, not applicable or unspecified: Secondary | ICD-10-CM | POA: Insufficient documentation

## 2015-04-28 DIAGNOSIS — O368131 Decreased fetal movements, third trimester, fetus 1: Secondary | ICD-10-CM

## 2015-04-28 NOTE — MAU Provider Note (Signed)
  History     CSN: [id number]  Arrival date and time: 04/28/15 1643     Chief Complaint  Patient presents with  . Decreased Fetal Movement   HPI Decreased FM  Past Medical History  Diagnosis Date  . GDM (gestational diabetes mellitus)     Past Surgical History  Procedure Laterality Date  . Abdominal surgery  MAY 2008    GASTRIC BYPASS  . Cesarean section    . Knee surgery Right     Family History  Problem Relation Age of Onset  . Diabetes Mother   . Hypertension Father   . Diabetes Maternal Grandmother   . Diabetes Paternal Grandmother     Social History  Substance Use Topics  . Smoking status: Current Every Day Smoker    Types: Cigarettes  . Smokeless tobacco: Never Used  . Alcohol Use: No    Allergies: No Known Allergies  Prescriptions prior to admission  Medication Sig Dispense Refill Last Dose  . cephALEXin (KEFLEX) 500 MG capsule Take 500 mg by mouth 4 (four) times daily.   04/27/2015 at Unknown time  . ferrous sulfate 325 (65 FE) MG tablet Take 325 mg by mouth daily with breakfast.   04/27/2015 at Unknown time  . folic acid (FOLVITE) 1 MG tablet Take 1 mg by mouth daily.   04/27/2015 at Unknown time  . Prenatal Multivit-Min-Fe-FA (PRENATAL VITAMINS PO) Take 1 tablet by mouth daily.    04/27/2015 at Unknown time  . clomiPHENE (CLOMID) 50 MG tablet Take one tablet daily from 5-9 # 5 (Patient not taking: Reported on 09/18/2014) 5 tablet 2 Completed Course at Unknown time  . metFORMIN (GLUCOPHAGE) 500 MG tablet Take one tablet daily (Patient not taking: Reported on 09/18/2014) 30 tablet 11 Not Taking at Unknown time    ROS Physical Exam   Blood pressure 116/77, pulse 100, temperature 98.3 F (36.8 C), temperature source Oral, resp. rate 18, height 6\' 1"  (1.854 m), weight 134.265 kg (296 lb), last menstrual period 08/19/2014.  Physical Exam  MAU Course  Procedures  NST - reactive  Assessment and Plan    Artelia Laroche 04/28/2015, 6:03 PM

## 2015-04-28 NOTE — MAU Note (Signed)
Patient presents at [redacted] weeks gestation with c/o decreased fetal movement since this morning. Denies pain, bleeding or discharge.

## 2015-05-08 ENCOUNTER — Inpatient Hospital Stay (HOSPITAL_COMMUNITY): Payer: BLUE CROSS/BLUE SHIELD

## 2015-05-08 ENCOUNTER — Encounter (HOSPITAL_COMMUNITY): Payer: Self-pay | Admitting: *Deleted

## 2015-05-08 ENCOUNTER — Inpatient Hospital Stay (HOSPITAL_COMMUNITY)
Admission: AD | Admit: 2015-05-08 | Discharge: 2015-05-08 | Disposition: A | Discharge status: Home / Self Care | Payer: BLUE CROSS/BLUE SHIELD | Source: Ambulatory Visit | Attending: Obstetrics and Gynecology | Admitting: Obstetrics and Gynecology

## 2015-05-08 DIAGNOSIS — O09523 Supervision of elderly multigravida, third trimester: Secondary | ICD-10-CM | POA: Insufficient documentation

## 2015-05-08 DIAGNOSIS — F17210 Nicotine dependence, cigarettes, uncomplicated: Secondary | ICD-10-CM | POA: Insufficient documentation

## 2015-05-08 DIAGNOSIS — O99843 Bariatric surgery status complicating pregnancy, third trimester: Secondary | ICD-10-CM | POA: Diagnosis not present

## 2015-05-08 DIAGNOSIS — O368130 Decreased fetal movements, third trimester, not applicable or unspecified: Secondary | ICD-10-CM | POA: Diagnosis not present

## 2015-05-08 DIAGNOSIS — O99333 Smoking (tobacco) complicating pregnancy, third trimester: Secondary | ICD-10-CM | POA: Insufficient documentation

## 2015-05-08 DIAGNOSIS — Z3A37 37 weeks gestation of pregnancy: Secondary | ICD-10-CM

## 2015-05-08 DIAGNOSIS — O368190 Decreased fetal movements, unspecified trimester, not applicable or unspecified: Secondary | ICD-10-CM | POA: Diagnosis present

## 2015-05-08 DIAGNOSIS — O3421 Maternal care for scar from previous cesarean delivery: Secondary | ICD-10-CM | POA: Diagnosis not present

## 2015-05-08 HISTORY — DX: Decreased fetal movements, unspecified trimester, not applicable or unspecified: O36.8190

## 2015-05-08 HISTORY — DX: Personal history of other diseases of the female genital tract: Z87.42

## 2015-05-08 NOTE — Discharge Instructions (Signed)
Braxton Hicks Contractions °Contractions of the uterus can occur throughout pregnancy. Contractions are not always a sign that you are in labor.  °WHAT ARE BRAXTON HICKS CONTRACTIONS?  °Contractions that occur before labor are called Braxton Hicks contractions, or false labor. Toward the end of pregnancy (32-34 weeks), these contractions can develop more often and may become more forceful. This is not true labor because these contractions do not result in opening (dilatation) and thinning of the cervix. They are sometimes difficult to tell apart from true labor because these contractions can be forceful and people have different pain tolerances. You should not feel embarrassed if you go to the hospital with false labor. Sometimes, the only way to tell if you are in true labor is for your health care provider to look for changes in the cervix. °If there are no prenatal problems or other health problems associated with the pregnancy, it is completely safe to be sent home with false labor and await the onset of true labor. °HOW CAN YOU TELL THE DIFFERENCE BETWEEN TRUE AND FALSE LABOR? °False Labor °· The contractions of false labor are usually shorter and not as hard as those of true labor.   °· The contractions are usually irregular.   °· The contractions are often felt in the front of the lower abdomen and in the groin.   °· The contractions may go away when you walk around or change positions while lying down.   °· The contractions get weaker and are shorter lasting as time goes on.   °· The contractions do not usually become progressively stronger, regular, and closer together as with true labor.   °True Labor °· Contractions in true labor last 30-70 seconds, become very regular, usually become more intense, and increase in frequency.   °· The contractions do not go away with walking.   °· The discomfort is usually felt in the top of the uterus and spreads to the lower abdomen and low back.   °· True labor can be  determined by your health care provider with an exam. This will show that the cervix is dilating and getting thinner.   °WHAT TO REMEMBER °· Keep up with your usual exercises and follow other instructions given by your health care provider.   °· Take medicines as directed by your health care provider.   °· Keep your regular prenatal appointments.   °· Eat and drink lightly if you think you are going into labor.   °· If Braxton Hicks contractions are making you uncomfortable:   °¨ Change your position from lying down or resting to walking, or from walking to resting.   °¨ Sit and rest in a tub of warm water.   °¨ Drink 2-3 glasses of water. Dehydration may cause these contractions.   °¨ Do slow and deep breathing several times an hour.   °WHEN SHOULD I SEEK IMMEDIATE MEDICAL CARE? °Seek immediate medical care if: °· Your contractions become stronger, more regular, and closer together.   °· You have fluid leaking or gushing from your vagina.   °· You have a fever.   °· You pass blood-tinged mucus.   °· You have vaginal bleeding.   °· You have continuous abdominal pain.   °· You have low back pain that you never had before.   °· You feel your baby's head pushing down and causing pelvic pressure.   °· Your baby is not moving as much as it used to.   °Document Released: 08/25/2005 Document Revised: 08/30/2013 Document Reviewed: 06/06/2013 °[employer]® Patient Information ©2015 [employer]. This information is not intended to replace advice given to you by your health care   provider. Make sure you discuss any questions you have with your health care provider. °Fetal Movement Counts °Patient Name: __________________________________________________ Patient Due Date: ____________________ °Performing a fetal movement count is highly recommended in high-risk pregnancies, but it is good for every pregnant woman to do. Your health care provider may ask you to start counting fetal movements at 28 weeks of the pregnancy. Fetal  movements often increase: °· After eating a full meal. °· After physical activity. °· After eating or drinking something sweet or cold. °· At rest. °Pay attention to when you feel the baby is most active. This will help you notice a pattern of your baby's sleep and wake cycles and what factors contribute to an increase in fetal movement. It is important to perform a fetal movement count at the same time each day when your baby is normally most active.  °HOW TO COUNT FETAL MOVEMENTS °· Find a quiet and comfortable area to sit or lie down on your left side. Lying on your left side provides the best blood and oxygen circulation to your baby. °· Write down the day and time on a sheet of paper or in a journal. °· Start counting kicks, flutters, swishes, rolls, or jabs in a 2-hour period. You should feel at least 10 movements within 2 hours. °· If you do not feel 10 movements in 2 hours, wait 2-3 hours and count again. Look for a change in the pattern or not enough counts in 2 hours. °SEEK MEDICAL CARE IF: °· You feel less than 10 counts in 2 hours, tried twice. °· There is no movement in over an hour. °· The pattern is changing or taking longer each day to reach 10 counts in 2 hours. °· You feel the baby is not moving as he or she usually does. °Date: ____________ Movements: ____________ Start time: ____________ Finish time: ____________  °Date: ____________ Movements: ____________ Start time: ____________ Finish time: ____________ °Date: ____________ Movements: ____________ Start time: ____________ Finish time: ____________ °Date: ____________ Movements: ____________ Start time: ____________ Finish time: ____________ °Date: ____________ Movements: ____________ Start time: ____________ Finish time: ____________ °Date: ____________ Movements: ____________ Start time: ____________ Finish time: ____________ °Date: ____________ Movements: ____________ Start time: ____________ Finish time: ____________ °Date: ____________  Movements: ____________ Start time: ____________ Finish time: ____________  °Date: ____________ Movements: ____________ Start time: ____________ Finish time: ____________ °Date: ____________ Movements: ____________ Start time: ____________ Finish time: ____________ °Date: ____________ Movements: ____________ Start time: ____________ Finish time: ____________ °Date: ____________ Movements: ____________ Start time: ____________ Finish time: ____________ °Date: ____________ Movements: ____________ Start time: ____________ Finish time: ____________ °Date: ____________ Movements: ____________ Start time: ____________ Finish time: ____________ °Date: ____________ Movements: ____________ Start time: ____________ Finish time: ____________  °Date: ____________ Movements: ____________ Start time: ____________ Finish time: ____________ °Date: ____________ Movements: ____________ Start time: ____________ Finish time: ____________ °Date: ____________ Movements: ____________ Start time: ____________ Finish time: ____________ °Date: ____________ Movements: ____________ Start time: ____________ Finish time: ____________ °Date: ____________ Movements: ____________ Start time: ____________ Finish time: ____________ °Date: ____________ Movements: ____________ Start time: ____________ Finish time: ____________ °Date: ____________ Movements: ____________ Start time: ____________ Finish time: ____________  °Date: ____________ Movements: ____________ Start time: ____________ Finish time: ____________ °Date: ____________ Movements: ____________ Start time: ____________ Finish time: ____________ °Date: ____________ Movements: ____________ Start time: ____________ Finish time: ____________ °Date: ____________ Movements: ____________ Start time: ____________ Finish time: ____________ °Date: ____________ Movements: ____________ Start time: ____________ Finish time: ____________ °Date: ____________ Movements: ____________ Start time:  ____________ Finish time: ____________ °Date: ____________ Movements: ____________   Start time: ____________ Finish time: ____________  °Date: ____________ Movements: ____________ Start time: ____________ Finish time: ____________ °Date: ____________ Movements: ____________ Start time: ____________ Finish time: ____________ °Date: ____________ Movements: ____________ Start time: ____________ Finish time: ____________ °Date: ____________ Movements: ____________ Start time: ____________ Finish time: ____________ °Date: ____________ Movements: ____________ Start time: ____________ Finish time: ____________ °Date: ____________ Movements: ____________ Start time: ____________ Finish time: ____________ °Date: ____________ Movements: ____________ Start time: ____________ Finish time: ____________  °Date: ____________ Movements: ____________ Start time: ____________ Finish time: ____________ °Date: ____________ Movements: ____________ Start time: ____________ Finish time: ____________ °Date: ____________ Movements: ____________ Start time: ____________ Finish time: ____________ °Date: ____________ Movements: ____________ Start time: ____________ Finish time: ____________ °Date: ____________ Movements: ____________ Start time: ____________ Finish time: ____________ °Date: ____________ Movements: ____________ Start time: ____________ Finish time: ____________ °Date: ____________ Movements: ____________ Start time: ____________ Finish time: ____________  °Date: ____________ Movements: ____________ Start time: ____________ Finish time: ____________ °Date: ____________ Movements: ____________ Start time: ____________ Finish time: ____________ °Date: ____________ Movements: ____________ Start time: ____________ Finish time: ____________ °Date: ____________ Movements: ____________ Start time: ____________ Finish time: ____________ °Date: ____________ Movements: ____________ Start time: ____________ Finish time: ____________ °Date:  ____________ Movements: ____________ Start time: ____________ Finish time: ____________ °Date: ____________ Movements: ____________ Start time: ____________ Finish time: ____________  °Date: ____________ Movements: ____________ Start time: ____________ Finish time: ____________ °Date: ____________ Movements: ____________ Start time: ____________ Finish time: ____________ °Date: ____________ Movements: ____________ Start time: ____________ Finish time: ____________ °Date: ____________ Movements: ____________ Start time: ____________ Finish time: ____________ °Date: ____________ Movements: ____________ Start time: ____________ Finish time: ____________ °Date: ____________ Movements: ____________ Start time: ____________ Finish time: ____________ °Document Released: 09/24/2006 Document Revised: 01/09/2014 Document Reviewed: 06/21/2012 °[employer]® Patient Information ©2015 [employer]. This information is not intended to replace advice given to you by your health care provider. Make sure you discuss any questions you have with your health care provider. ° °

## 2015-05-08 NOTE — MAU Provider Note (Signed)
History     CSN: [id number]  Arrival date and time: 05/08/15 1924  Provider notified: 2011 Provider on unit: 2030 Provider at bedside: 2110     Chief Complaint  Patient presents with  . Decreased Fetal Movement   HPI  Ms. Monica Buchanan is a 39 yo G2P1001 female at 37.[redacted] wks gestation presenting with complaints of decreased fetal movement x 3 hours. She was seen not too long ago for this same complaints. Complications to prenatal care are as follows: AMA, Previous Cesarean Delivery, H/O Gastric Bypass Surgery, GDMA1. Her primary OB at [facility] is Dr. Garwin Brothers.  Past Medical History  Diagnosis Date  . Decreased fetal movement 05/08/2015  . H/O infertility     Past Surgical History  Procedure Laterality Date  . Abdominal surgery  MAY 2008    GASTRIC BYPASS  . Cesarean section    . Knee surgery Right     Family History  Problem Relation Age of Onset  . Diabetes Mother   . Hypertension Father   . Diabetes Maternal Grandmother   . Diabetes Paternal Grandmother     Social History  Substance Use Topics  . Smoking status: Current Every Day Smoker    Types: Cigarettes  . Smokeless tobacco: Never Used  . Alcohol Use: No    Allergies: No Known Allergies  Prescriptions prior to admission  Medication Sig Dispense Refill Last Dose  . cephALEXin (KEFLEX) 500 MG capsule Take 500 mg by mouth 4 (four) times daily.   05/07/2015 at Unknown time  . ferrous sulfate 325 (65 FE) MG tablet Take 325 mg by mouth daily with breakfast.   05/07/2015 at Unknown time  . folic acid (FOLVITE) 1 MG tablet Take 1 mg by mouth daily.   05/07/2015 at Unknown time  . Prenatal Multivit-Min-Fe-FA (PRENATAL VITAMINS PO) Take 1 tablet by mouth daily.    05/07/2015 at Unknown time  . metFORMIN (GLUCOPHAGE) 500 MG tablet Take one tablet daily (Patient not taking: Reported on 09/18/2014) 30 tablet 11 Not Taking at Unknown time    Review of Systems  Constitutional: Negative.   HENT: Negative.   Eyes: Negative.    Respiratory: Negative.   Cardiovascular: Negative.   Gastrointestinal: Negative.   Genitourinary:       Decreased fetal movement x 3 hrs; only small wiggly movements since arrival in MAU  Musculoskeletal: Negative.   Skin: Negative.   Neurological: Negative.   Endo/Heme/Allergies: Negative.   Psychiatric/Behavioral: Negative.    CEFM FHR: 135 bpm / moderate variability / accels present / no decels present TOCO: Ui with Occ UC  BPP: 8/8 / AFI: WNL  Physical Exam   Blood pressure 124/80, pulse 86, temperature 98.6 F (37 C), temperature source Oral, resp. rate 18, height 6\' 1"  (1.854 m), weight 136.533 kg (301 lb), last menstrual period 08/19/2014, SpO2 100 %.  Physical Exam  Constitutional: She is oriented to person, place, and time. She appears well-developed and well-nourished.  Morbidly obese  HENT:  Head: Normocephalic.  Eyes: Pupils are equal, round, and reactive to light.  Neck: Normal range of motion.  Cardiovascular: Normal rate.   Respiratory: Effort normal.  GI: Soft.  Genitourinary:  Gravid; S>D; (+) FM  Musculoskeletal: Normal range of motion.  Neurological: She is alert and oriented to person, place, and time.  Skin: Skin is warm and dry.  Psychiatric: She has a normal mood and affect. Her behavior is normal. Judgment and thought content normal.    MAU Course  Procedures NST  BPP Assessment and Plan  G2P1001 at 37.[redacted] wks gestation Decreased Fetal Movement Category 1 FHR tracing  Discharge Home Fetal Kick Counts reviewed / instructed to do FKCs daily Explained to pt and family that normal FHR tracing and limited sono are reassuring Keep scheduled appt at [facility] tomorrow for immediate f/u and OB visit  Laury Deep, M MSN, CNM 05/08/2015, 8:47 PM

## 2015-05-08 NOTE — MAU Note (Signed)
Pt reports no fetal movement for the last 3 hours. Denies bleeding or pain.

## 2015-05-16 ENCOUNTER — Encounter (HOSPITAL_COMMUNITY)
Admission: RE | Admit: 2015-05-16 | Discharge: 2015-05-16 | Disposition: A | Discharge status: Home / Self Care | Payer: BLUE CROSS/BLUE SHIELD | Source: Ambulatory Visit | Attending: Obstetrics and Gynecology | Admitting: Obstetrics and Gynecology

## 2015-05-16 ENCOUNTER — Encounter (HOSPITAL_COMMUNITY): Payer: Self-pay

## 2015-05-16 VITALS — BP 119/74 | HR 86 | Resp 18 | Ht 73.0 in | Wt <= 1120 oz

## 2015-05-16 DIAGNOSIS — O368130 Decreased fetal movements, third trimester, not applicable or unspecified: Secondary | ICD-10-CM

## 2015-05-16 HISTORY — DX: Anemia, unspecified: D64.9

## 2015-05-16 HISTORY — DX: Other specified health status: Z78.9

## 2015-05-16 HISTORY — DX: Type 2 diabetes mellitus without complications: E11.9

## 2015-05-16 LAB — CBC
HCT: 36.1 % (ref 36.0–46.0)
Hemoglobin: 11.9 g/dL — ABNORMAL LOW (ref 12.0–15.0)
MCH: 29.5 pg (ref 26.0–34.0)
MCHC: 33 g/dL (ref 30.0–36.0)
MCV: 89.4 fL (ref 78.0–100.0)
Platelets: 336 10*3/uL (ref 150–400)
RBC: 4.04 MIL/uL (ref 3.87–5.11)
RDW: 13.7 % (ref 11.5–15.5)
WBC: 12.3 10*3/uL — ABNORMAL HIGH (ref 4.0–10.5)

## 2015-05-16 LAB — BASIC METABOLIC PANEL
Anion gap: 8 (ref 5–15)
BUN: 9 mg/dL (ref 6–20)
CALCIUM: 8.7 mg/dL — AB (ref 8.9–10.3)
CO2: 20 mmol/L — ABNORMAL LOW (ref 22–32)
CREATININE: 0.72 mg/dL (ref 0.44–1.00)
Chloride: 109 mmol/L (ref 101–111)
GFR calc Af Amer: >60 mL/min (ref >60)
GLUCOSE: 94 mg/dL (ref 65–99)
Potassium: 3.8 mmol/L (ref 3.5–5.1)
SODIUM: 137 mmol/L (ref 135–145)

## 2015-05-16 LAB — TYPE AND SCREEN
ABO/RH(D): O POS
Antibody Screen: NEGATIVE

## 2015-05-16 LAB — ABO/RH: ABO/RH(D): O POS

## 2015-05-16 NOTE — Patient Instructions (Signed)
Your procedure is scheduled on:05/18/15  Enter through the Main Entrance at :9;45 AM  Pick up desk phone and dial [phone] and inform us of your arrival.  Please call [phone] if you have any problems the morning of surgery.  Remember: Do not eat food after midnight:Thursday Clear liquids are ok until:7am on Fri   You may brush your teeth the morning of surgery.    DO NOT wear jewelry, eye make-up, lipstick,body lotion, or dark fingernail polish.  (Polished toes are ok) You may wear deodorant.  If you are to be admitted after surgery, leave suitcase in car until your room has been assigned. Patients discharged on the day of surgery will not be allowed to drive home. Wear loose fitting, comfortable clothes for your ride home.

## 2015-05-17 LAB — RPR: RPR Ser Ql: NONREACTIVE

## 2015-05-18 ENCOUNTER — Encounter (HOSPITAL_COMMUNITY)
Admission: RE | Disposition: A | Discharge status: Home / Self Care | Payer: Self-pay | Source: Ambulatory Visit | Attending: Obstetrics and Gynecology

## 2015-05-18 ENCOUNTER — Inpatient Hospital Stay (HOSPITAL_COMMUNITY): Payer: BLUE CROSS/BLUE SHIELD | Admitting: Anesthesiology

## 2015-05-18 ENCOUNTER — Encounter (HOSPITAL_COMMUNITY): Payer: Self-pay | Admitting: Anesthesiology

## 2015-05-18 ENCOUNTER — Inpatient Hospital Stay (HOSPITAL_COMMUNITY)
Admission: RE | Admit: 2015-05-18 | Discharge: 2015-05-20 | DRG: 766 | Disposition: A | Discharge status: Home / Self Care | Payer: BLUE CROSS/BLUE SHIELD | Source: Ambulatory Visit | Attending: Obstetrics and Gynecology | Admitting: Obstetrics and Gynecology

## 2015-05-18 DIAGNOSIS — Z8249 Family history of ischemic heart disease and other diseases of the circulatory system: Secondary | ICD-10-CM

## 2015-05-18 DIAGNOSIS — O9962 Diseases of the digestive system complicating childbirth: Secondary | ICD-10-CM | POA: Diagnosis present

## 2015-05-18 DIAGNOSIS — Z3A39 39 weeks gestation of pregnancy: Secondary | ICD-10-CM | POA: Diagnosis present

## 2015-05-18 DIAGNOSIS — Z833 Family history of diabetes mellitus: Secondary | ICD-10-CM

## 2015-05-18 DIAGNOSIS — K660 Peritoneal adhesions (postprocedural) (postinfection): Secondary | ICD-10-CM | POA: Diagnosis present

## 2015-05-18 DIAGNOSIS — O99844 Bariatric surgery status complicating childbirth: Secondary | ICD-10-CM | POA: Diagnosis present

## 2015-05-18 DIAGNOSIS — O3663X0 Maternal care for excessive fetal growth, third trimester, not applicable or unspecified: Secondary | ICD-10-CM | POA: Diagnosis present

## 2015-05-18 DIAGNOSIS — O24419 Gestational diabetes mellitus in pregnancy, unspecified control: Secondary | ICD-10-CM | POA: Diagnosis present

## 2015-05-18 DIAGNOSIS — K219 Gastro-esophageal reflux disease without esophagitis: Secondary | ICD-10-CM | POA: Diagnosis present

## 2015-05-18 DIAGNOSIS — Z87891 Personal history of nicotine dependence: Secondary | ICD-10-CM | POA: Diagnosis not present

## 2015-05-18 DIAGNOSIS — O3421 Maternal care for scar from previous cesarean delivery: Principal | ICD-10-CM | POA: Diagnosis present

## 2015-05-18 LAB — GLUCOSE, CAPILLARY: Glucose-Capillary: 75 mg/dL (ref 65–99)

## 2015-05-18 SURGERY — Surgical Case
Anesthesia: Spinal | Site: Abdomen

## 2015-05-18 MED ORDER — OXYTOCIN 10 UNIT/ML IJ SOLN
INTRAMUSCULAR | Status: AC
  Filled 2015-05-18: qty 4

## 2015-05-18 MED ORDER — FENTANYL CITRATE (PF) 100 MCG/2ML IJ SOLN
INTRAMUSCULAR | Status: AC
  Filled 2015-05-18: qty 4

## 2015-05-18 MED ORDER — ACETAMINOPHEN 325 MG PO TABS
650.0000 mg | ORAL_TABLET | ORAL | Status: DC | PRN
  Filled 2015-05-18: qty 2

## 2015-05-18 MED ORDER — NALOXONE HCL 0.4 MG/ML IJ SOLN: 0.4000 mg | INTRAMUSCULAR | Status: DC | PRN

## 2015-05-18 MED ORDER — PHENYLEPHRINE 40 MCG/ML (10ML) SYRINGE FOR IV PUSH (FOR BLOOD PRESSURE SUPPORT)
PREFILLED_SYRINGE | INTRAVENOUS | Status: AC
  Filled 2015-05-18: qty 10

## 2015-05-18 MED ORDER — DIPHENHYDRAMINE HCL 25 MG PO CAPS
25.0000 mg | ORAL_CAPSULE | ORAL | Status: DC | PRN
  Administered 2015-05-19: 25 mg via ORAL

## 2015-05-18 MED ORDER — LACTATED RINGERS IV SOLN
INTRAVENOUS | Status: DC
  Administered 2015-05-18: 20:00:00 via INTRAVENOUS

## 2015-05-18 MED ORDER — NALBUPHINE HCL 10 MG/ML IJ SOLN: 5.0000 mg | INTRAMUSCULAR | Status: DC | PRN

## 2015-05-18 MED ORDER — OXYCODONE-ACETAMINOPHEN 5-325 MG PO TABS
1.0000 | ORAL_TABLET | ORAL | Status: DC | PRN
  Administered 2015-05-19 – 2015-05-20 (×5): 1 via ORAL
  Filled 2015-05-18 (×5): qty 1

## 2015-05-18 MED ORDER — DIPHENHYDRAMINE HCL 50 MG/ML IJ SOLN: 12.5000 mg | INTRAMUSCULAR | Status: DC | PRN

## 2015-05-18 MED ORDER — PHENYLEPHRINE 8 MG IN D5W 100 ML (0.08MG/ML) PREMIX OPTIME
INJECTION | INTRAVENOUS | Status: DC | PRN
  Administered 2015-05-18: 60 ug/min via INTRAVENOUS

## 2015-05-18 MED ORDER — SIMETHICONE 80 MG PO CHEW
80.0000 mg | CHEWABLE_TABLET | Freq: Three times a day (TID) | ORAL | Status: DC
Start: 2015-05-18 — End: 2015-05-20
  Administered 2015-05-18 – 2015-05-20 (×6): 80 mg via ORAL
  Filled 2015-05-18 (×5): qty 1

## 2015-05-18 MED ORDER — KETOROLAC TROMETHAMINE 30 MG/ML IJ SOLN
INTRAMUSCULAR | Status: AC
  Filled 2015-05-18: qty 1

## 2015-05-18 MED ORDER — PHENYLEPHRINE 8 MG IN D5W 100 ML (0.08MG/ML) PREMIX OPTIME
INJECTION | INTRAVENOUS | Status: AC
  Filled 2015-05-18: qty 100

## 2015-05-18 MED ORDER — BUPIVACAINE HCL (PF) 0.25 % IJ SOLN
INTRAMUSCULAR | Status: AC
  Filled 2015-05-18: qty 10

## 2015-05-18 MED ORDER — MENTHOL 3 MG MT LOZG: 1.0000 | LOZENGE | OROMUCOSAL | Status: DC | PRN

## 2015-05-18 MED ORDER — METHYLERGONOVINE MALEATE 0.2 MG PO TABS: 0.2000 mg | ORAL_TABLET | ORAL | Status: DC | PRN

## 2015-05-18 MED ORDER — NALOXONE HCL 1 MG/ML IJ SOLN: 1.0000 ug/kg/h | INTRAVENOUS | Status: DC | PRN

## 2015-05-18 MED ORDER — LANOLIN HYDROUS EX OINT: 1.0000 "application " | TOPICAL_OINTMENT | CUTANEOUS | Status: DC | PRN

## 2015-05-18 MED ORDER — MORPHINE SULFATE 0.5 MG/ML IJ SOLN
INTRAMUSCULAR | Status: AC
  Filled 2015-05-18: qty 100

## 2015-05-18 MED ORDER — NALBUPHINE HCL 10 MG/ML IJ SOLN
5.0000 mg | Freq: Once | INTRAMUSCULAR | Status: AC | PRN
  Administered 2015-05-18: 5 mg via SUBCUTANEOUS

## 2015-05-18 MED ORDER — FENTANYL CITRATE (PF) 100 MCG/2ML IJ SOLN
25.0000 ug | INTRAMUSCULAR | Status: DC | PRN
  Administered 2015-05-18: 50 ug via INTRAVENOUS

## 2015-05-18 MED ORDER — MEPERIDINE HCL 25 MG/ML IJ SOLN: 6.2500 mg | INTRAMUSCULAR | Status: DC | PRN

## 2015-05-18 MED ORDER — INFLUENZA VAC SPLIT QUAD 0.5 ML IM SUSY
0.5000 mL | PREFILLED_SYRINGE | INTRAMUSCULAR | Status: AC
  Administered 2015-05-19: 0.5 mL via INTRAMUSCULAR

## 2015-05-18 MED ORDER — SODIUM CHLORIDE 0.9 % IJ SOLN: 3.0000 mL | INTRAMUSCULAR | Status: DC | PRN

## 2015-05-18 MED ORDER — NALBUPHINE HCL 10 MG/ML IJ SOLN
5.0000 mg | INTRAMUSCULAR | Status: DC | PRN
Start: 2015-05-18 — End: 2015-05-19

## 2015-05-18 MED ORDER — WITCH HAZEL-GLYCERIN EX PADS: 1.0000 "application " | MEDICATED_PAD | CUTANEOUS | Status: DC | PRN

## 2015-05-18 MED ORDER — 0.9 % SODIUM CHLORIDE (POUR BTL) OPTIME
TOPICAL | Status: DC | PRN
  Administered 2015-05-18: 1000 mL

## 2015-05-18 MED ORDER — FLEET ENEMA 7-19 GM/118ML RE ENEM: 1.0000 | ENEMA | Freq: Every day | RECTAL | Status: DC | PRN

## 2015-05-18 MED ORDER — NALBUPHINE HCL 10 MG/ML IJ SOLN
INTRAMUSCULAR | Status: AC
  Administered 2015-05-18: 5 mg via SUBCUTANEOUS
  Filled 2015-05-18: qty 1

## 2015-05-18 MED ORDER — IBUPROFEN 600 MG PO TABS: 600.0000 mg | ORAL_TABLET | Freq: Four times a day (QID) | ORAL | Status: DC | PRN

## 2015-05-18 MED ORDER — CEFAZOLIN SODIUM-DEXTROSE 2-3 GM-% IV SOLR
INTRAVENOUS | Status: AC
  Administered 2015-05-18: 3 g via INTRAVENOUS
  Filled 2015-05-18: qty 50

## 2015-05-18 MED ORDER — METHYLERGONOVINE MALEATE 0.2 MG/ML IJ SOLN: 0.2000 mg | INTRAMUSCULAR | Status: DC | PRN

## 2015-05-18 MED ORDER — BISACODYL 10 MG RE SUPP: 10.0000 mg | Freq: Every day | RECTAL | Status: DC | PRN

## 2015-05-18 MED ORDER — DIPHENHYDRAMINE HCL 25 MG PO CAPS
25.0000 mg | ORAL_CAPSULE | Freq: Four times a day (QID) | ORAL | Status: DC | PRN
  Filled 2015-05-18: qty 1

## 2015-05-18 MED ORDER — SODIUM CHLORIDE 0.9 % IV SOLN: 250.0000 mL | INTRAVENOUS | Status: DC

## 2015-05-18 MED ORDER — ONDANSETRON HCL 4 MG/2ML IJ SOLN: 4.0000 mg | Freq: Three times a day (TID) | INTRAMUSCULAR | Status: DC | PRN

## 2015-05-18 MED ORDER — ONDANSETRON HCL 4 MG/2ML IJ SOLN
INTRAMUSCULAR | Status: AC
  Filled 2015-05-18: qty 2

## 2015-05-18 MED ORDER — ERYTHROMYCIN 5 MG/GM OP OINT
TOPICAL_OINTMENT | OPHTHALMIC | Status: AC
  Filled 2015-05-18: qty 1

## 2015-05-18 MED ORDER — CEFAZOLIN SODIUM 1-5 GM-% IV SOLN
1.0000 g | Freq: Once | INTRAVENOUS | Status: DC
  Filled 2015-05-18: qty 50

## 2015-05-18 MED ORDER — IBUPROFEN 600 MG PO TABS
600.0000 mg | ORAL_TABLET | Freq: Four times a day (QID) | ORAL | Status: DC
Start: 2015-05-18 — End: 2015-05-19
  Filled 2015-05-18: qty 1

## 2015-05-18 MED ORDER — LACTATED RINGERS IV SOLN
INTRAVENOUS | Status: DC
  Administered 2015-05-18 (×4): via INTRAVENOUS

## 2015-05-18 MED ORDER — FENTANYL CITRATE (PF) 100 MCG/2ML IJ SOLN
INTRAMUSCULAR | Status: AC
  Administered 2015-05-18: 50 ug via INTRAVENOUS
  Filled 2015-05-18: qty 2

## 2015-05-18 MED ORDER — FERROUS SULFATE 325 (65 FE) MG PO TABS
325.0000 mg | ORAL_TABLET | Freq: Two times a day (BID) | ORAL | Status: DC
  Administered 2015-05-18: 325 mg via ORAL
  Filled 2015-05-18: qty 1

## 2015-05-18 MED ORDER — ONDANSETRON HCL 4 MG/2ML IJ SOLN
INTRAMUSCULAR | Status: DC | PRN
  Administered 2015-05-18: 4 mg via INTRAVENOUS

## 2015-05-18 MED ORDER — OXYTOCIN 40 UNITS IN LACTATED RINGERS INFUSION - SIMPLE MED
INTRAVENOUS | Status: DC | PRN
  Administered 2015-05-18: 40 [IU] via INTRAVENOUS

## 2015-05-18 MED ORDER — OXYTOCIN 40 UNITS IN LACTATED RINGERS INFUSION - SIMPLE MED: 62.5000 mL/h | INTRAVENOUS | Status: DC

## 2015-05-18 MED ORDER — SODIUM CHLORIDE 0.9 % IJ SOLN: 3.0000 mL | Freq: Two times a day (BID) | INTRAMUSCULAR | Status: DC

## 2015-05-18 MED ORDER — KETOROLAC TROMETHAMINE 30 MG/ML IJ SOLN
30.0000 mg | Freq: Four times a day (QID) | INTRAMUSCULAR | Status: DC | PRN
  Administered 2015-05-18: 30 mg via INTRAVENOUS

## 2015-05-18 MED ORDER — SCOPOLAMINE 1 MG/3DAYS TD PT72
MEDICATED_PATCH | TRANSDERMAL | Status: DC
Start: 2015-05-18 — End: 2015-05-20
  Administered 2015-05-18: 1.5 mg via TRANSDERMAL
  Filled 2015-05-18: qty 1

## 2015-05-18 MED ORDER — VITAMIN K1 1 MG/0.5ML IJ SOLN
INTRAMUSCULAR | Status: AC
  Filled 2015-05-18: qty 0.5

## 2015-05-18 MED ORDER — OXYCODONE-ACETAMINOPHEN 5-325 MG PO TABS
2.0000 | ORAL_TABLET | ORAL | Status: DC | PRN
  Administered 2015-05-19 – 2015-05-20 (×2): 2 via ORAL
  Filled 2015-05-18 (×2): qty 2

## 2015-05-18 MED ORDER — NALBUPHINE HCL 10 MG/ML IJ SOLN: 5.0000 mg | Freq: Once | INTRAMUSCULAR | Status: AC | PRN

## 2015-05-18 MED ORDER — SIMETHICONE 80 MG PO CHEW
80.0000 mg | CHEWABLE_TABLET | ORAL | Status: DC | PRN
  Filled 2015-05-18: qty 1

## 2015-05-18 MED ORDER — DIBUCAINE 1 % RE OINT: 1.0000 "application " | TOPICAL_OINTMENT | RECTAL | Status: DC | PRN

## 2015-05-18 MED ORDER — CEPHALEXIN 500 MG PO CAPS
500.0000 mg | ORAL_CAPSULE | Freq: Every day | ORAL | Status: DC
  Administered 2015-05-18 – 2015-05-19 (×2): 500 mg via ORAL
  Filled 2015-05-18 (×3): qty 1

## 2015-05-18 MED ORDER — PHENYLEPHRINE HCL 10 MG/ML IJ SOLN
INTRAMUSCULAR | Status: DC | PRN
  Administered 2015-05-18: 40 ug via INTRAVENOUS

## 2015-05-18 MED ORDER — CEFAZOLIN SODIUM-DEXTROSE 2-3 GM-% IV SOLR: 2.0000 g | INTRAVENOUS | Status: DC

## 2015-05-18 MED ORDER — ZOLPIDEM TARTRATE 5 MG PO TABS: 5.0000 mg | ORAL_TABLET | Freq: Every evening | ORAL | Status: DC | PRN

## 2015-05-18 MED ORDER — SIMETHICONE 80 MG PO CHEW
80.0000 mg | CHEWABLE_TABLET | ORAL | Status: DC
  Administered 2015-05-19 (×2): 80 mg via ORAL
  Filled 2015-05-18 (×2): qty 1

## 2015-05-18 MED ORDER — MORPHINE SULFATE (PF) 0.5 MG/ML IJ SOLN
INTRAMUSCULAR | Status: DC | PRN
  Administered 2015-05-18: .1 mg via INTRATHECAL

## 2015-05-18 MED ORDER — SCOPOLAMINE 1 MG/3DAYS TD PT72
1.0000 | MEDICATED_PATCH | Freq: Once | TRANSDERMAL | Status: DC
  Administered 2015-05-18: 1.5 mg via TRANSDERMAL

## 2015-05-18 MED ORDER — SENNOSIDES-DOCUSATE SODIUM 8.6-50 MG PO TABS
2.0000 | ORAL_TABLET | ORAL | Status: DC
  Administered 2015-05-19 (×2): 2 via ORAL
  Filled 2015-05-18 (×2): qty 2

## 2015-05-18 MED ORDER — LACTATED RINGERS IV SOLN
INTRAVENOUS | Status: DC | PRN
  Administered 2015-05-18: 12:00:00 via INTRAVENOUS

## 2015-05-18 MED ORDER — PRENATAL MULTIVITAMIN CH
1.0000 | ORAL_TABLET | Freq: Every day | ORAL | Status: DC
  Administered 2015-05-19 – 2015-05-20 (×2): 1 via ORAL
  Filled 2015-05-18 (×2): qty 1

## 2015-05-18 MED ORDER — BUPIVACAINE HCL (PF) 0.25 % IJ SOLN
INTRAMUSCULAR | Status: DC | PRN
  Administered 2015-05-18: 10 mL

## 2015-05-18 MED ORDER — KETOROLAC TROMETHAMINE 30 MG/ML IJ SOLN: 30.0000 mg | Freq: Four times a day (QID) | INTRAMUSCULAR | Status: DC | PRN

## 2015-05-18 SURGICAL SUPPLY — 42 items
BARRIER ADHS 3X4 INTERCEED (GAUZE/BANDAGES/DRESSINGS) IMPLANT
BENZOIN TINCTURE PRP APPL 2/3 (GAUZE/BANDAGES/DRESSINGS) IMPLANT
CLAMP CORD UMBIL (MISCELLANEOUS) ×2 IMPLANT
CLOTH BEACON ORANGE TIMEOUT ST (SAFETY) ×2 IMPLANT
CONTAINER PREFILL 10% NBF 15ML (MISCELLANEOUS) ×2 IMPLANT
DRAPE C SECTION CLR SCREEN (DRAPES) ×2 IMPLANT
DRAPE SHEET LG 3/4 BI-LAMINATE (DRAPES) ×2 IMPLANT
DRSG OPSITE POSTOP 4X10 (GAUZE/BANDAGES/DRESSINGS) ×2 IMPLANT
DURAPREP 26ML APPLICATOR (WOUND CARE) ×2 IMPLANT
ELECT REM PT RETURN 9FT ADLT (ELECTROSURGICAL) ×2
ELECTRODE REM PT RTRN 9FT ADLT (ELECTROSURGICAL) ×1 IMPLANT
EXTRACTOR VACUUM M CUP 4 TUBE (SUCTIONS) IMPLANT
GLOVE BIOGEL PI IND STRL 7.0 (GLOVE) ×1 IMPLANT
GLOVE BIOGEL PI INDICATOR 7.0 (GLOVE) ×1
GLOVE ECLIPSE 6.5 STRL STRAW (GLOVE) ×2 IMPLANT
GOWN STRL REUS W/TWL LRG LVL3 (GOWN DISPOSABLE) ×8 IMPLANT
KIT ABG SYR 3ML LUER SLIP (SYRINGE) IMPLANT
NEEDLE HYPO 22GX1.5 SAFETY (NEEDLE) ×2 IMPLANT
NEEDLE HYPO 25X5/8 SAFETYGLIDE (NEEDLE) IMPLANT
NS IRRIG 1000ML POUR BTL (IV SOLUTION) ×2 IMPLANT
PACK C SECTION WH (CUSTOM PROCEDURE TRAY) ×2 IMPLANT
PAD OB MATERNITY 4.3X12.25 (PERSONAL CARE ITEMS) ×2 IMPLANT
PENCIL SMOKE EVAC W/HOLSTER (ELECTROSURGICAL) ×2 IMPLANT
RTRCTR C-SECT PINK 25CM LRG (MISCELLANEOUS) ×2 IMPLANT
STAPLER VISISTAT 35W (STAPLE) ×2 IMPLANT
STRIP CLOSURE SKIN 1/2X4 (GAUZE/BANDAGES/DRESSINGS) IMPLANT
SUT CHROMIC GUT AB #0 18 (SUTURE) IMPLANT
SUT MNCRL 0 VIOLET CTX 36 (SUTURE) ×3 IMPLANT
SUT MON AB 4-0 PS1 27 (SUTURE) IMPLANT
SUT MONOCRYL 0 CTX 36 (SUTURE) ×3
SUT PLAIN 2 0 (SUTURE)
SUT PLAIN 2 0 XLH (SUTURE) ×2 IMPLANT
SUT PLAIN ABS 2-0 CT1 27XMFL (SUTURE) IMPLANT
SUT VIC AB 0 CT1 27 (SUTURE) ×1
SUT VIC AB 0 CT1 27XBRD ANTBC (SUTURE) ×1 IMPLANT
SUT VIC AB 0 CT1 36 (SUTURE) ×4 IMPLANT
SUT VIC AB 2-0 CT1 27 (SUTURE) ×1
SUT VIC AB 2-0 CT1 TAPERPNT 27 (SUTURE) ×1 IMPLANT
SUT VIC AB 4-0 PS2 27 (SUTURE) IMPLANT
SYR CONTROL 10ML LL (SYRINGE) ×2 IMPLANT
TOWEL OR 17X24 6PK STRL BLUE (TOWEL DISPOSABLE) ×2 IMPLANT
TRAY FOLEY CATH SILVER 14FR (SET/KITS/TRAYS/PACK) ×2 IMPLANT

## 2015-05-18 NOTE — Transfer of Care (Signed)
Immediate Anesthesia Transfer of Care Note  Patient: Monica Buchanan  Procedure(s) Performed: Procedure(s) with comments: Repeat CESAREAN SECTION (N/A) - EDD: 05/21/15  Patient Location: PACU  Anesthesia Type:Spinal and Epidural  Level of Consciousness: awake, alert  and oriented  Airway & Oxygen Therapy: Patient Spontanous Breathing  Post-op Assessment: Report given to RN and Post -op Vital signs reviewed and stable  Post vital signs: Reviewed and stable  Last Vitals:  Filed Vitals:   05/18/15 0931  BP: 114/73  Pulse: 75  Temp: 36.5 C  Resp: 16    Complications: No apparent anesthesia complications

## 2015-05-18 NOTE — Brief Op Note (Signed)
05/18/2015  1:40 PM  PATIENT:  Monica Buchanan  39 y.o. female  PRE-OPERATIVE DIAGNOSIS:  Previous Cesarean Section, Gestational Diabetes, History of Gastric Bypass, fetal macrosomia, term gestation  POST-OPERATIVE DIAGNOSIS:  Previous Cesarean Section, Gestational Diabetes, History of Gastric Bypass, term gestation, fetal macrosomia  PROCEDURE:  Repeat Cesarean section, kerr hysterotomy, Lysis of adhesions, removal of right round ligament lesion  SURGEON:  Surgeon(s) and Role:    * Corporate treasurer, MD - Primary  PHYSICIAN ASSISTANT:   ASSISTANTS: Artelia Laroche, CNM   ANESTHESIA:   spinal Findings: omental adhesion to anterior abdominal wall, live female floating vtx, nl tubes and ovaries, right round ligament lesion 1 cm firm mass, CANx 1 EBL:  Total I/O In: 4500 [I.V.:4500] Out: 1100 [Urine:300; Blood:800]  BLOOD ADMINISTERED:none  DRAINS: none   LOCAL MEDICATIONS USED:  MARCAINE     SPECIMEN:  Source of Specimen:  right round ligament lesion  DISPOSITION OF SPECIMEN:  PATHOLOGY  COUNTS:  YES  TOURNIQUET:  * No tourniquets in log *  DICTATION: .Other Dictation: Dictation Number [id number]  PLAN OF CARE: Admit to inpatient   PATIENT DISPOSITION:  PACU - hemodynamically stable.   Delay start of Pharmacological VTE agent (>24hrs) due to surgical blood loss or risk of bleeding: no

## 2015-05-18 NOTE — Anesthesia Postprocedure Evaluation (Signed)
  Anesthesia Post-op Note  Patient: Monica Buchanan  Procedure(s) Performed: Procedure(s) with comments: Repeat CESAREAN SECTION (N/A) - EDD: 05/21/15  Patient Location: PACU  Anesthesia Type:Spinal  Level of Consciousness: awake, alert  and oriented  Airway and Oxygen Therapy: Patient Spontanous Breathing  Post-op Pain: none  Post-op Assessment: Post-op Vital signs reviewed, Patient's Cardiovascular Status Stable, Respiratory Function Stable, Patent Airway, No signs of Nausea or vomiting, Pain level controlled, No headache, No backache, Spinal receding and Patient able to bend at knees LLE Motor Response: Purposeful movement (slight knee flexion) LLE Sensation: Decreased, Increased RLE Motor Response: Purposeful movement (slight knee flexion) RLE Sensation: Tingling, Increased      Post-op Vital Signs: Reviewed and stable  Last Vitals:  Filed Vitals:   05/18/15 1500  BP: 109/57  Pulse: 52  Temp:   Resp: 20    Complications: No apparent anesthesia complications

## 2015-05-18 NOTE — Anesthesia Procedure Notes (Signed)
Epidural Patient location during procedure: OR  Preanesthetic Checklist Completed: patient identified, site marked, surgical consent, pre-op evaluation, timeout performed, IV checked, risks and benefits discussed and monitors and equipment checked  Epidural Patient position: sitting Prep: site prepped and draped and DuraPrep Patient monitoring: continuous pulse ox and blood pressure Approach: midline Location: L3-L4 Injection technique: LOR air  Needle:  Needle type: Tuohy  Needle gauge: 17 G Needle length: 9 cm and 9 Needle insertion depth: 9 cm Catheter type: closed end flexible Catheter size: 19 Gauge Catheter at skin depth: 15 cm Test dose: negative  Assessment Sensory level: T4 Events: blood not aspirated, injection not painful, no injection resistance, negative IV test and no paresthesia  Additional Notes Sprotte thru tuohy Spinal Dosage in OR  Bupivicaine ml       2.2 PFMS04   mcg        100 Catheter (-) asp.......  CSF/Heme

## 2015-05-18 NOTE — Anesthesia Preprocedure Evaluation (Addendum)
Anesthesia Evaluation  Patient identified by MRN, date of birth, ID band Patient awake    Reviewed: Allergy & Precautions, NPO status , Patient's Chart, lab work & pertinent test results  Airway Mallampati: III  TM Distance: >3 FB Neck ROM: Full    Dental no notable dental hx. (+) Teeth Intact   Pulmonary former smoker,    Pulmonary exam normal breath sounds clear to auscultation       Cardiovascular negative cardio ROS Normal cardiovascular exam Rhythm:Regular Rate:Normal     Neuro/Psych negative neurological ROS  negative psych ROS   GI/Hepatic Neg liver ROS, GERD  Medicated,Hx/o Gastric bypass   Endo/Other  diabetes, Well Controlled, GestationalObesity  Renal/GU negative Renal ROS  negative genitourinary   Musculoskeletal negative musculoskeletal ROS (+)   Abdominal (+) + obese,   Peds  Hematology  (+) anemia ,   Anesthesia Other Findings   Reproductive/Obstetrics (+) Pregnancy Previous C/Section                             Anesthesia Physical Anesthesia Plan  ASA: II  Anesthesia Plan: Combined Spinal and Epidural   Post-op Pain Management:    Induction:   Airway Management Planned: Natural Airway  Additional Equipment:   Intra-op Plan:   Post-operative Plan:   Informed Consent: I have reviewed the patients History and Physical, chart, labs and discussed the procedure including the risks, benefits and alternatives for the proposed anesthesia with the patient or authorized representative who has indicated his/her understanding and acceptance.   Dental advisory given  Plan Discussed with: Anesthesiologist, CRNA and Surgeon  Anesthesia Plan Comments:         Anesthesia Quick Evaluation

## 2015-05-18 NOTE — Progress Notes (Signed)
I stopped to check on patient, I offered order her meals but she declined help, by Juliann Mule Spanish Interpreter.

## 2015-05-19 ENCOUNTER — Encounter (HOSPITAL_COMMUNITY): Payer: Self-pay | Admitting: *Deleted

## 2015-05-19 LAB — CBC
HCT: 32.6 % — ABNORMAL LOW (ref 36.0–46.0)
HEMOGLOBIN: 10.2 g/dL — AB (ref 12.0–15.0)
MCH: 28.3 pg (ref 26.0–34.0)
MCHC: 31.3 g/dL (ref 30.0–36.0)
MCV: 90.3 fL (ref 78.0–100.0)
PLATELETS: 258 10*3/uL (ref 150–400)
RBC: 3.61 MIL/uL — AB (ref 3.87–5.11)
RDW: 14 % (ref 11.5–15.5)
WBC: 13.6 10*3/uL — AB (ref 4.0–10.5)

## 2015-05-19 NOTE — Op Note (Signed)
Monica Buchanan, Monica Buchanan              ACCOUNT NO.:  [id number]  MEDICAL RECORD NO.:  [id number]  LOCATION:  9130                          FACILITY:  [facility]  PHYSICIAN:  Servando Salina, M.D.DATE OF BIRTH:  1975/10/25  DATE OF PROCEDURE:  05/18/2015 DATE OF DISCHARGE:                              OPERATIVE REPORT   PREOPERATIVE DIAGNOSES: 1. Previous cesarean section, term gestation. 2. Gestational diabetes, Class a1. 3. History of gastric bypass. 4. Fetal macrosomia.  PROCEDURE: 1. Repeat cesarean section, Kerr hysterotomy. 2. Lysis of adhesions. 3. Removal of right round ligament mass.  POSTOPERATIVE DIAGNOSES: 1. Previous cesarean section, term gestation. 2. Gestational diabetes, diet controlled. 3. History of gastric bypass. 4. Fetal macrosomia. 5. Right round ligament mass.  ANESTHESIA:  Spinal.  SURGEON:  Servando Salina, M.D.  ASSISTANT:  Artelia Laroche, C.N.M.  DESCRIPTION OF PROCEDURE:  Under adequate spinal anesthesia, the patient was placed in the supine position.  She was sterilely prepped and draped in usual fashion.  An indwelling Foley catheter was sterilely placed.  A 0.25% Marcaine was injected along the previous Pfannenstiel skin incision.  Pfannenstiel skin incision was then carried down to the rectus fascia.  Rectus fascia was then opened transversely.  The rectus fascia was then bluntly and sharply dissected off the rectus muscle in superior and inferior fashion.  The rectus muscle was split in the midline with sharp dissection.  The parietal peritoneum was entered bluntly and at that time, it was noted to be covered with omental adhesion.  Under direct visualization, the superior dissection was continued for the fascia as well as opening the peritoneum, superiorly and inferiorly.  At that point, the omentum was dissected off the anterior wall to facilitate the surgery.  A portion of it was dissected off the anterior wall, allowing for placement of a  self- retaining Chiropodist.  At that point, the vesicouterine peritoneum was opened transversely.  The bladder, which was adherent.  It was sharply dissected off the lower uterine segment displaced inferiorly.  A curvilinear low transverse uterine incision was then made and extended with bandage scissors.  Artificial rupture of membranes occurred.  Clear amniotic fluid noted.  Floating vertex was encountered, which an initial attempt was unsuccessful in delivery, resulted in the use of a vacuum for facilitation of the delivery of a live female, and the head was delivered, cord around the neck was noted, which was reducible.  The baby was bulb suctioned at that point, and subsequent delivered.  Cord was clamped and cut.  The baby was transferred to the awaiting pediatrician with an Apgars of 9 and 9 at 1 and 5 minutes.  The placenta was manually removed.  Uterine cavity was cleaned of debris.  Uterine incision had no extensions and was closed in 2 layers using 0 Monocryl running locked stitch, second layer was imbricated using 0 Monocryl suture.  Bleeding on the right angle resulted a figure-of-eight suture being placed.  The abdomen was irrigated and suctioned of debris.  The Alexis retractor was removed.  Interceed at the lower portion was placed on the transverse incision.  The attention was turned to the omentum. The extensive adhesions to the anterior wall was carefully  lysed with cautery.  The defect in the omentum was resolved with clamping and cutting excess omental tissue and free tie with 0 Vicryl suture.  Once the omentum was completely free, tubes and ovaries were identified.  The both ovaries were normal.  The right round ligament had a 1 cm raised white firm mass, which was grasped and removed with cautery.  This was sent to Pathology.  The pelvis was otherwise unremarkable.  The remaining Interceed was placed in a vertical fashion.  The parietal peritoneum was then  closed with 2-0 Vicryl.  The rectus fascia was closed with 0 Vicryl x2.  The subcutaneous area was irrigated, small bleeders cauterized.  Interrupted 2-0 plain sutures placed and the skin approximated with Ethicon staples.  SPECIMEN:  Right round ligament mass sent to Pathology.  Placenta was not sent.  ESTIMATED BLOOD LOSS:  800 mL.  INTRAOPERATIVE FLUID:  4500 mL.  URINE OUTPUT:  300 mL clear yellow urine.  COUNTS:  Sponge and instrument counts x2 was correct.  COMPLICATION:  None.  The patient tolerated the procedure well.  The baby was placed on skin to skin.  His weight was 9 pounds 5 ounces.     Servando Salina, M.D.     [place]/MEDQ  D:  05/19/2015  T:  05/19/2015  Job:  [id number]

## 2015-05-19 NOTE — Progress Notes (Signed)
POSTOPERATIVE DAY # 1 S/P repeat CS / macrosomia / GDM  S:         Reports feeling well - little to no pain - feels "good"             Tolerating po intake / no nausea / no vomiting / no flatus / no BM             Bleeding is light             Pain controlled withpercocet             Up ad lib / ambulatory/ voiding QS  Newborn breast-feeding with some formula supplementation  / Circumcision this AM  O:  VS: BP 120/66 mmHg  Pulse 79  Temp(Src) 98.4 F (36.9 C) (Oral)  Resp 18  Wt 136.079 kg (300 lb)  SpO2 100%  LMP 08/19/2014  Breastfeeding? Unknown   LABS:               Recent Labs  05/16/15 1425 05/19/15 0550  WBC 12.3* 13.6*  HGB 11.9* 10.2*  PLT 336 258               Bloodtype: --/--/O POS, O POS (09/07 1425)  Rubella: Immune (02/29 0000)                tdap 2016  (needs flu)                                         I&O: net positive 2450             Physical Exam:             Alert and Oriented X3  Lungs: Clear and unlabored  Heart: regular rate and rhythm / no mumurs  Abdomen: soft, non-tender, non-distended, hypoactive BS             Fundus: firm, non-tender, Ueven             Dressing intact              Incision:  approximated with staple / no erythema / no ecchymosis / marked drainage  Perineum: intact  Lochia: light  Extremities: trace edema, no calf pain or tenderness  A:        POD # 1 S/P repeat CS             Hx GDM with macrosomia - feedings with formula to avoid newborn hypoglycemia  P:        Routine postoperative care              Anticipate DC tomorrow AM   Artelia Laroche CNM, MSN, [facility] 05/19/2015, 9:08 AM

## 2015-05-19 NOTE — Addendum Note (Signed)
Addendum  created 05/19/15 0753 by Asher Muir, CRNA   Modules edited: Notes Section   Notes Section:  File: [id number]

## 2015-05-19 NOTE — Anesthesia Postprocedure Evaluation (Signed)
Anesthesia Post Note  Patient: Monica Buchanan  Procedure(s) Performed: Procedure(s) (LRB): Repeat CESAREAN SECTION (N/A)  Anesthesia type: Spinal  Patient location: Mother/Baby  Post pain: Pain level controlled  Post assessment: Post-op Vital signs reviewed  Last Vitals:  Filed Vitals:   05/19/15 0304  BP: 114/65  Pulse: 61  Temp: 36.7 C  Resp: 18    Post vital signs: Reviewed  Level of consciousness: awake  Complications: No apparent anesthesia complications

## 2015-05-20 ENCOUNTER — Encounter (HOSPITAL_COMMUNITY): Payer: Self-pay | Admitting: Obstetrics and Gynecology

## 2015-05-20 MED ORDER — BISACODYL 10 MG RE SUPP
10.0000 mg | Freq: Once | RECTAL | Status: AC
  Administered 2015-05-20: 10 mg via RECTAL
  Filled 2015-05-20: qty 1

## 2015-05-20 MED ORDER — OXYCODONE-ACETAMINOPHEN 5-325 MG PO TABS: 1.0000 | ORAL_TABLET | ORAL | Status: DC | PRN

## 2015-05-20 NOTE — Progress Notes (Signed)
Checked on patients needs. Also ordered patients dinner, late snack and breakfast Spanish interpreter

## 2015-05-20 NOTE — Progress Notes (Signed)
Checked on patients needs also took patients order for breakfast.  Spanish Interpreter

## 2015-05-20 NOTE — Progress Notes (Signed)
Checked on patient and ordered patients dinner and late snack.  Spanish Interpreter

## 2015-05-20 NOTE — Discharge Summary (Signed)
POSTOPERATIVE DISCHARGE SUMMARY:  Patient ID: ETNA FORQUER MRN: [id number] DOB/AGE: 1976/04/08 39 y.o.  Admit date: 05/18/2015 Admission Diagnoses: 39 weeks / GDM class a1 / previous CS / macrosomia, hx gastric bypass, recurrent UTI  Discharge date:  05/20/2015 Discharge Diagnoses: POD 2 s/p repeat cesarean section, Class A1 GDM, fetal macrosomia, hx gastric bypass, recurrent UTI  Prenatal history: [id number]   EDC : 05/25/2015, Alternate EDD Entry  Prenatal care at [facility]  Primary provider : Orly Quimby Prenatal course complicated by previous CS / GDM / LGA - suspect macrosomia / UTI-recurrent  Prenatal Labs: ABO, Rh: --/--/O POS, O POS (09/07 1425)  Antibody: NEG (09/07 1425) Rubella: Immune (02/29 0000) RPR: Non Reactive (09/07 1425)  HBsAg: Negative (02/29 0000)  HIV: Non-reactive (02/29 0000)  GTT : ABN GBS:   neg  Medical / Surgical History :  Past medical history:  Past Medical History  Diagnosis Date  . Decreased fetal movement 05/08/2015  . H/O infertility   . Anemia   . Medical history non-contributory   . Diabetes mellitus without complication     gestational per OR consent order    Past surgical history:  Past Surgical History  Procedure Laterality Date  . Abdominal surgery  MAY 2008    GASTRIC BYPASS  . Cesarean section      x1  . Knee surgery Right     Family History:  Family History  Problem Relation Age of Onset  . Diabetes Mother   . Hypertension Father   . Diabetes Maternal Grandmother   . Diabetes Paternal Grandmother     Social History:  reports that she quit smoking about 14 months ago. Her smoking use included Cigarettes. She has never used smokeless tobacco. She reports that she does not drink alcohol or use illicit drugs.  Allergies: Review of patient's allergies indicates no known allergies.   Current Medications at time of admission:  Prior to Admission medications   Medication Sig Start Date End Date Taking?  Authorizing Provider  cephALEXin (KEFLEX) 500 MG capsule Take 500 mg by mouth 4 (four) times daily.    Historical Provider, MD  ferrous sulfate 325 (65 FE) MG tablet Take 325 mg by mouth daily with breakfast.    Historical Provider, MD  folic acid (FOLVITE) 1 MG tablet Take 1 mg by mouth daily.    Historical Provider, MD  oxyCODONE-acetaminophen (PERCOCET/ROXICET) 5-325 MG per tablet Take 1 tablet by mouth every 4 (four) hours as needed (for pain scale 4-7). 05/20/15   Artelia Laroche, CNM  Prenatal Multivit-Min-Fe-FA (PRENATAL VITAMINS PO) Take 1 tablet by mouth daily.     Historical Provider, MD    Intrapartum Course:  Procedures: Cesarean section delivery on 05/18/2015 with delivery of viable female newborn by Dr Maudry Diego   See operative report for further details APGAR (1 MIN): 9   APGAR (5 MINS): 9    Postoperative / postpartum course:  Uncomplicated with discharge on POD 2   Discharge Instructions:  Discharged Condition: stable  Activity: pelvic rest and postoperative restrictions x 2   Diet: routine  Medications:    Medication List    TAKE these medications        cephALEXin 500 MG capsule  Commonly known as:  KEFLEX  Take 500 mg by mouth 4 (four) times daily.     ferrous sulfate 325 (65 FE) MG tablet  Take 325 mg by mouth daily with breakfast.     folic acid 1 MG tablet  Commonly known as:  FOLVITE  Take 1 mg by mouth daily.     oxyCODONE-acetaminophen 5-325 MG per tablet  Commonly known as:  PERCOCET/ROXICET  Take 1 tablet by mouth every 4 (four) hours as needed (for pain scale 4-7).     PRENATAL VITAMINS PO  Take 1 tablet by mouth daily.        Wound Care: keep clean and dry / dressing change prior to leaving hospital - office will remove dressing and staples at OV visit this week Postpartum Instructions: [facility] discharge booklet - instructions reviewed  Discharge to: Home  Follow up :  [facility] in 4-5 days for interval visit with nurse for dressing and  staple removal [facility] in 6 weeks for routine postpartum visit with Dr Garwin Brothers                Signed: Artelia Laroche CNM, MSN, [facility] 05/20/2015, 11:27 AM

## 2015-05-20 NOTE — Lactation Note (Signed)
This note was copied from the chart of [place]. Lactation Consultation Note  Patient Name: Monica Buchanan ULAGT'X Date: 05/20/2015 Reason for consult: Initial assessment Baby 4 hours old. LC assessment and education with assistance of in-house Spanish interpreter "Monica Buchanan." Mom nursed her first child for 3 months, 15 years ago, without any issues. Mom states that she became engorged with first child. Mom states that she did not think she had enough milk for baby initially which is why she was supplementing. Also, baby has a strong suckle, and mom's nipples are now very tender. Mom's left breast is engorged. Assisted mom to use hand pump, mom could not tolerate hand expression, and baby given 15 mls with bottle. Mom is wearing a bra with underwire, discussed how this can impact breast milk supply and comfort and enc mom to remove her bra. Discussed engorgement prevention/treatment measures. Mom given ice packs and enc to use for 15 minutes, then massage and hand express, hand pump, and/or use DEBP--whatever is most effective and comfortable. Left a second hand pump to use simultaneously if needed. Enc mom to express for comfort, but to put baby to breast first with cues, then supplement if baby not satisfied at breast or if she is too sore to nurse. Discussed that baby is most effective pump if possible to nurse. Mom given comfort gels with instructions as well. Discussed importance of keeping the milk flowing and not sleeping while breasts are engorged. Discussed assessment, interventions, and feeding plan with patient's RN, [place].  Maternal Data    Feeding Feeding Type: Breast Fed Nipple Type: Slow - flow Length of feed: 2 min  LATCH Score/Interventions Latch: Grasps breast easily, tongue down, lips flanged, rhythmical sucking.  Audible Swallowing: Spontaneous and intermittent  Type of Nipple: Everted at rest and after stimulation  Comfort (Breast/Nipple): Filling, red/small  blisters or bruises, mild/mod discomfort  Problem noted: Cracked, bleeding, blisters, bruises Interventions  (Cracked/bleeding/bruising/blister): Double electric pump;Hand pump Interventions (Mild/moderate discomfort): Hand massage;Hand expression;Pre-pump if needed;Comfort gels  Hold (Positioning): Assistance needed to correctly position infant at breast and maintain latch. Intervention(s): Breastfeeding basics reviewed;Support Pillows;Position options;Skin to skin  LATCH Score: 8  Lactation Tools Discussed/Used Pump Review: Setup, frequency, and cleaning;Milk Storage Initiated by:: JW Date initiated:: 05/20/15   Consult Status Consult Status: Follow-up Date: 05/21/15 Follow-up type: In-patient    Inocente Salles 05/20/2015, 4:32 PM

## 2015-05-20 NOTE — Progress Notes (Signed)
Checked on patients needs.  °Spanish interpreter  °

## 2015-05-20 NOTE — Progress Notes (Signed)
POSTOPERATIVE DAY # 2 S/P CS   S:         Reports feeling well and ready to go home             Tolerating po intake / no nausea / no vomiting / + flatus / no BM             Bleeding is light             Pain controlled with percocet             Up ad lib / ambulatory/ voiding QS  Newborn breast feeding  / Circumcision done  O:  VS: BP 110/68 mmHg  Pulse 68  Temp(Src) 98.9 F (37.2 C) (Oral)  Resp 18  Wt 136.079 kg (300 lb)  SpO2 99%  LMP 08/19/2014  Breastfeeding? Unknown   LABS:               Recent Labs  05/19/15 0550  WBC 13.6*  HGB 10.2*  PLT 258               Bloodtype: --/--/O POS, O POS (09/07 1425)  Rubella: Immune (02/29 0000)                                             I&O: Intake/Output      09/10 0701 - 09/11 0700 09/11 0701 - 09/12 0700   P.O.     I.V. (mL/kg)     Other     Total Intake(mL/kg)     Urine (mL/kg/hr) 550 (0.2)    Blood     Total Output 550     Net -550                       Physical Exam:             Alert and Oriented X3  Abdomen: soft, non-tender, non-distended, active BS              Fundus: firm, non-tender, Ueven             Dressing intact honeycomb              Incision:  approximated with staples / no erythema / no ecchymosis / dried drainage  Perineum: intact  Lochia: light  Extremities: trace edema, no calf pain or tenderness, negative Homans  A:        POD # 2 S/P CS            GDM - delivered  P:        Routine postoperative care              DC home             Staple removal at office end of this week - change dressing prior to Dc home              WOB booklet - instructions reviewed      Artelia Laroche CNM, MSN, [facility] 05/20/2015, 11:22 AM

## 2015-05-21 ENCOUNTER — Ambulatory Visit: Payer: Self-pay

## 2015-05-21 ENCOUNTER — Encounter (HOSPITAL_COMMUNITY): Payer: Self-pay | Admitting: Obstetrics and Gynecology

## 2015-05-21 NOTE — Lactation Note (Signed)
This note was copied from the chart of [place]. Lactation Consultation Note: Mother states that her milk is in. She is very proud of pumping 30-60 ml after infant breastfed. Mother's breast are firm. Advised her to ice and massage breast every 3-4 hours. Mother denies having any pain or tenderness when infant is breastfeeding. She states she is hearing infant swallow. Mother has a hand pump . Advised to post pump for a few mins after feeding. Mother advised to continue to cue base feed. She is aware of available [facility] services as needed.   Patient Name: Monica Buchanan [id number]'[id number] Date: 05/21/2015 Reason for consult: Follow-up assessment   Maternal Data    Feeding    LATCH Score/Interventions                      Lactation Tools Discussed/Used     Consult Status Consult Status: Complete    Darla Lesches 05/21/2015, 12:16 PM

## 2015-05-21 NOTE — Lactation Note (Addendum)
This note was copied from the chart of [name]. Lactation Consultation Note F/u w/engorgement. Mom stated she pumped for 72min. Still has some knots espeacially in Rt. Anterior breast. ICE for 15-20 min. Then massage and pump for 15 min. To relieve breast. Baby BF well and is sleeping. Mom communicated well with me telling what she had done. Had pumped at 01:30 1oz. In 30min. Discussed putting in ref. If needed. Mom wearing comfort gels in supportive BF bra. Has some bruising to nipples. Discussed stimulating nipple to evert well. Has good everted nipples. Mom told me the baby keeps closing his mouth after she gets him wide. I explained he is still learning and to keep opening and making sure he has a good latch. After BF encouraged to pump and massage breast to relieve it and not to leave breast w/knots. Mom stated she got more out w/DEBP than hand pump. Patient Name: Monica Buchanan [id number] Date: 05/21/2015 Reason for consult: Follow-up assessment;Breast/nipple pain   Maternal Data    Feeding    LATCH Score/Interventions          Comfort (Breast/Nipple): Engorged, cracked, bleeding, large blisters, severe discomfort Problem noted: Cracked, bleeding, blisters, bruises;Engorgment Intervention(s): Ice;Hand expression Intervention(s): Double electric pump;Expressed breast milk to nipple  Problem noted: Mild/Moderate discomfort Interventions  (Cracked/bleeding/bruising/blister): Double electric pump Interventions (Mild/moderate discomfort): Comfort gels;Hand massage;Hand expression;Post-pump        Lactation Tools Discussed/Used Tools: Pump Breast pump type: Double-Electric Breast Pump   Consult Status Consult Status: Follow-up Follow-up type: In-patient    [name] 05/21/2015, 2:29 AM

## 2015-07-09 ENCOUNTER — Ambulatory Visit: Payer: Self-pay | Admitting: Surgery

## 2015-07-09 NOTE — H&P (Signed)
History of Present Illness Monica Buchanan. Reynolds Kittel MD; 07/09/2015 11:16 AM) Patient words: lipoma back.  The patient is a 39 year old female who presents with a complaint of Mass. Referred by Dr. Garwin Brothers for evaluation of lipoma of the back  This is a 39 year old female who is 2 months postpartum and breast-feeding who presents with an enlarging mass on her back. The patient states that she had an excision of a lipoma in this area in 2010. This was performed by a Psychologist, sport and exercise in [place]. She noticed a mass growing in this area last year and it has become larger. There is some mild discomfort associated with this. She would like to have this area excised completely so it does not come back.   Other Problems (Ammie Eversole, LPN; 55/73/2202 54:27 AM) Back Pain Hemorrhoids  Past Surgical History (Ammie Eversole, LPN; 03/01/7627 31:51 AM) Cesarean Section - Multiple Gastric Bypass Knee Surgery Right.  Diagnostic Studies History (Ammie Eversole, LPN; 76/16/0737 10:62 AM) Colonoscopy never Mammogram never Pap Smear 1-5 years ago  Allergies (Ammie Eversole, LPN; 69/48/5462 70:35 AM) No Known Drug Allergies 07/09/2015  Medication History (Ammie Eversole, LPN; 00/93/8182 99:37 AM) Keflex (500MG  Capsule, Oral) Active. Ferrous Sulfate (324 (65 Fe)MG Tablet DR, Oral) Active. Folvite (5MG /ML Solution, Injection) Active. Prenatal Forte (Oral) Active. Medications Reconciled  Social History (Ammie Eversole, LPN; 16/96/7893 81:01 AM) Caffeine use Coffee. No alcohol use No drug use Tobacco use Former smoker.  Family History (Aleatha Borer, LPN; 75/06/2584 27:78 AM) Cerebrovascular Accident Mother. Diabetes Mellitus Family Members In General, Mother. Hypertension Father, Mother.  Pregnancy / Birth History Aleatha Borer, LPN; 24/23/5361 44:31 AM) Age at menarche 49 years. Contraceptive History Intrauterine device. Gravida 2 Maternal age 54-25 Para 2 Regular  periods     Review of Systems (Ammie Eversole LPN; 54/00/8676 19:50 AM) General Not Present- Appetite Loss, Chills, Fatigue, Fever, Night Sweats, Weight Gain and Weight Loss. Skin Not Present- Change in Wart/Mole, Dryness, Hives, Jaundice, New Lesions, Non-Healing Wounds, Rash and Ulcer. HEENT Not Present- Earache, Hearing Loss, Hoarseness, Nose Bleed, Oral Ulcers, Ringing in the Ears, Seasonal Allergies, Sinus Pain, Sore Throat, Visual Disturbances, Wears glasses/contact lenses and Yellow Eyes. Respiratory Not Present- Bloody sputum, Chronic Cough, Difficulty Breathing, Snoring and Wheezing. Breast Not Present- Breast Mass, Breast Pain, Nipple Discharge and Skin Changes. Gastrointestinal Not Present- Abdominal Pain, Bloating, Bloody Stool, Change in Bowel Habits, Chronic diarrhea, Constipation, Difficulty Swallowing, Excessive gas, Gets full quickly at meals, Hemorrhoids, Indigestion, Nausea, Rectal Pain and Vomiting. Female Genitourinary Not Present- Frequency, Nocturia, Painful Urination, Pelvic Pain and Urgency. Musculoskeletal Present- Back Pain. Not Present- Joint Pain, Joint Stiffness, Muscle Pain, Muscle Weakness and Swelling of Extremities. Neurological Not Present- Decreased Memory, Fainting, Headaches, Numbness, Seizures, Tingling, Tremor, Trouble walking and Weakness. Psychiatric Not Present- Anxiety, Bipolar, Change in Sleep Pattern, Depression, Fearful and Frequent crying. Endocrine Not Present- Cold Intolerance, Excessive Hunger, Hair Changes, Heat Intolerance, Hot flashes and New Diabetes. Hematology Not Present- Easy Bruising, Excessive bleeding, Gland problems, HIV and Persistent Infections.  Vitals (Ammie Eversole LPN; 93/26/7124 58:09 AM) 07/09/2015 11:00 AM Weight: 272.6 lb Height: 73in Body Surface Area: 2.45 m Body Mass Index: 35.96 kg/m  Temp.: 97.40F(Oral)  Pulse: 74 (Regular)  BP: 112/78 (Sitting, Left Arm, Standard)      Physical Exam Rodman Key  K. Delynn Olvera MD; 07/09/2015 11:16 AM)  The physical exam findings are as follows: Note:WDWN in NAD HEENT: EOMI, sclera anicteric Neck: No masses, no thyromegaly Lungs: CTA bilaterally; normal respiratory effort CV: Regular rate and rhythm;  no murmurs Abd: +bowel sounds, soft, non-tender, no masses Ext: Well-perfused; no edema Back: - mid thoracic just to the right of midline - healed vertical incision - underlying 4 cm well-demarcated smooth protruding soft tissue mass Skin: Warm, dry; no sign of jaundice    Assessment & Plan Rodman Key K. Yasira Engelson MD; 07/09/2015 11:10 AM)  LIPOMA OF BACK (D17.1) Story: 4 cm - right of midline  Current Plans Schedule for Surgery - Excision of lipoma of the back - The surgical procedure has been discussed with the patient. Potential risks, benefits, alternative treatments, and expected outcomes have been explained. All of the patient's questions at this time have been answered. The likelihood of reaching the patient's treatment goal is good. The patient understand the proposed surgical procedure and wishes to proceed.  Monica Buchanan. Georgette Dover, MD, [facility]  General/ Trauma Surgery  07/09/2015 11:17 AM

## 2015-07-18 ENCOUNTER — Telehealth: Payer: Self-pay | Admitting: *Deleted

## 2015-07-18 ENCOUNTER — Telehealth: Payer: Self-pay | Admitting: Gynecology

## 2015-07-18 ENCOUNTER — Encounter: Payer: Self-pay | Admitting: Gynecology

## 2015-07-18 ENCOUNTER — Ambulatory Visit (INDEPENDENT_AMBULATORY_CARE_PROVIDER_SITE_OTHER): Payer: BLUE CROSS/BLUE SHIELD | Admitting: Gynecology

## 2015-07-18 VITALS — BP 130/88 | Ht 73.0 in | Wt 271.0 lb

## 2015-07-18 DIAGNOSIS — E282 Polycystic ovarian syndrome: Secondary | ICD-10-CM

## 2015-07-18 DIAGNOSIS — Z8742 Personal history of other diseases of the female genital tract: Secondary | ICD-10-CM | POA: Diagnosis not present

## 2015-07-18 DIAGNOSIS — Z01419 Encounter for gynecological examination (general) (routine) without abnormal findings: Secondary | ICD-10-CM

## 2015-07-18 DIAGNOSIS — Z8632 Personal history of gestational diabetes: Secondary | ICD-10-CM | POA: Diagnosis not present

## 2015-07-18 DIAGNOSIS — E1310 Other specified diabetes mellitus with ketoacidosis without coma: Secondary | ICD-10-CM

## 2015-07-18 NOTE — Telephone Encounter (Signed)
07/18/15-I checked the pt BC ins for Mirena benefits for contraception and it is covered at 100%. Claudia told the pt in [place]. DXF#[id number]

## 2015-07-18 NOTE — Telephone Encounter (Signed)
-----   Message from Terrance Mass, MD sent at 07/18/2015  3:14 PM EST ----- Please make apppointment for this patient with Dr. Renne Crigler Endocrinologist. Patient Type II Diabetic/PCOS

## 2015-07-18 NOTE — Patient Instructions (Signed)
Informacin sobre el dispositivo intrauterino (Intrauterine Device Information) Un dispositivo intrauterino (DIU) se inserta en el tero e impide el embarazo. Hay dos tipos de DIU:   DIU de cobre: este tipo de DIU est recubierto con un alambre de cobre y se inserta dentro del tero. El cobre hace que el tero y las trompas de [place] produzcan un liquido que [employer] espermatozoides. El DIU de cobre puede Nutritional therapist durante 10 aos.  DIU con hormona: este tipo de DIU contiene la hormona progestina (progesterona sinttica). Las hormonas hacen que el moco cervical se haga ms espeso, lo que evita que el esperma ingrese al tero. Tambin hace que la membrana que recubre internamente al tero sea ms delgada lo que impide el implante del vulo fertilizado. La hormona debilita o destruye los espermatozoides que ingresan al tero. Alguno de los tipos de DIU hormonal pueden Nutritional therapist durante 5 aos y otros tipos pueden dejarse en el lugar por 3 aos. El mdico se asegurar de que usted sea una buena candidata para usar el DIU. Converse con su mdico acerca de los posibles efectos secundarios.  VENTAJAS DEL [place] es muy eficaz, reversible, de accin prolongada y de bajo mantenimiento.  No hay efectos secundarios relacionados con el estrgeno.  El DIU puede ser utilizado durante la Transport planner.  No est asociado con el aumento de [place].  Funciona inmediatamente despus de la insercin.  El DIU hormonal funciona inmediatamente si se inserta dentro de los 7 das del inicio del perodo. Ser necesario que utilice un mtodo anticonceptivo adicional durante 7 das si el DIU hormonal se inserta en algn otro momento del ciclo.  El DIU de cobre no interfiere con las hormonas femeninas.  El DIU hormonal puede hacer que los perodos menstruales abundantes se hagan ms ligeros y que haya menos clicos.  El DIU hormonal puede usarse durante 3 a 5  aos.  El DIU de cobre puede usarse durante 10 aos. DESVENTAJAS DEL DISPOSITIVO INTRAUTERINO  El DIU hormonal puede estar asociado con patrones de sangrado irregular.  El DIU de cobre puede hacer que el flujo menstrual ms abundante y doloroso.  Puede experimentar clicos y sangrado vaginal despus de la insercin.   Esta informacin no tiene Marine scientist el consejo del mdico. Asegrese de hacerle al mdico cualquier pregunta que tenga.   Document Released: 02/12/2010 Document Revised: 04/27/2013 Elsevier Interactive Patient Education [employer].

## 2015-07-18 NOTE — Progress Notes (Signed)
Monica Buchanan October 09, 1975 [id number]   History:    39 y.o.  for annual gyn exam who is now a gravida 2 para 2 who in September 9 delivered via cesarean section as a result of a macrosomic baby. Patient stated that she was diagnosed with gestational diabetes. Patient states that she has been checking her blood sugars at home fingerstick and her fasting blood sugars have been less than 90. She states that if she goes more than 2 hours without eating she feels weak and begins to sweat and when she checks her blood sugars during that time to have fluctuated between 44 and 65. Patient is currently breast-feeding and wanted to discuss contraceptive options. Patient does have a history of PCOS and had conceived this pregnancy with ovulation induction medication. Patient with no previous history of any abnormal Pap smears. Patient several years ago had gastric bypass.   Past medical history,surgical history, family history and social history were all reviewed and documented in the EPIC chart.  Gynecologic History Patient's last menstrual period was 06/15/2015. Contraception: none Last Pap: 2015. Results were: normal Last mammogram: Not indicated. Results were: Not indicated  Obstetric History OB History  Gravida Para Term Preterm AB SAB TAB Ectopic Multiple Living  2 2 2  0 0 0 0 0 0 2    # Outcome Date GA Lbr Len/2nd Weight Sex Delivery Anes PTL Lv  2 Term 05/18/15 [redacted]w[redacted]d  9 lb 5.2 oz (4.23 kg) M CS-LTranv Spinal  Y  1 Term                ROS: A ROS was performed and pertinent positives and negatives are included in the history.  GENERAL: No fevers or chills. HEENT: No change in vision, no earache, sore throat or sinus congestion. NECK: No pain or stiffness. CARDIOVASCULAR: No chest pain or pressure. No palpitations. PULMONARY: No shortness of breath, cough or wheeze. GASTROINTESTINAL: No abdominal pain, nausea, vomiting or diarrhea, melena or bright red blood per rectum. GENITOURINARY: No  urinary frequency, urgency, hesitancy or dysuria. MUSCULOSKELETAL: No joint or muscle pain, no back pain, no recent trauma. DERMATOLOGIC: No rash, no itching, no lesions. ENDOCRINE: No polyuria, polydipsia, no heat or cold intolerance. No recent change in weight. HEMATOLOGICAL: No anemia or easy bruising or bleeding. NEUROLOGIC: No headache, seizures, numbness, tingling or weakness. PSYCHIATRIC: No depression, no loss of interest in normal activity or change in sleep pattern.     Exam: chaperone present  BP 130/88 mmHg  Ht 6\' 1"  (1.854 m)  Wt 271 lb (122.925 kg)  BMI 35.76 kg/m2  LMP 06/15/2015  Breastfeeding? Yes  Body mass index is 35.76 kg/(m^2).  General appearance : Well developed well nourished female. No acute distress HEENT: Eyes: no retinal hemorrhage or exudates,  Neck supple, trachea midline, no carotid bruits, no thyroidmegaly Lungs: Clear to auscultation, no rhonchi or wheezes, or rib retractions  Heart: Regular rate and rhythm, no murmurs or gallops Breast:Examined in sitting and supine position were symmetrical in appearance, no palpable masses or tenderness,  no skin retraction, no nipple inversion, no nipple discharge, no skin discoloration, no axillary or supraclavicular lymphadenopathy Abdomen: no palpable masses or tenderness, no rebound or guarding Extremities: no edema or skin discoloration or tenderness  Pelvic:  Bartholin, Urethra, Skene Glands: Within normal limits             Vagina: No gross lesions or discharge  Cervix: No gross lesions or discharge  Uterus  anteverted, normal size,  shape and consistency, non-tender and mobile  Adnexa  Without masses or tenderness  Anus and perineum  normal   Rectovaginal  normal sphincter tone without palpated masses or tenderness             Hemoccult not indicated     Assessment/Plan:  39 y.o. female for annual exam will return back next week for placement of the Mirena IUD. Literature information was provided. She is  about to start her menses. Other alternatives for contraception discussed were the new [place], and Depo-Provera injectable contraception or progesterone only oral contraceptive pills since she is breast-feeding. She has decided to proceed with the Mirena IUD. I'm going to make arrangements for her to see the endocrinologist because of her history of gestational diabetes and episodes of severe hypoglycemia for further evaluation. When she returns for her Mirena IUD placement she will come in a fasting states so the following blood work will be ordered: Comprehensive metabolic panel, fasting lipid profile, TSH, CBC, and hemoglobin A1c along with urinalysis.   Terrance Mass MD, 4:18 PM 07/18/2015

## 2015-07-18 NOTE — Telephone Encounter (Signed)
Referral placed they will contact pt to schedule. 

## 2015-07-19 ENCOUNTER — Ambulatory Visit: Payer: BLUE CROSS/BLUE SHIELD | Admitting: Gynecology

## 2015-07-19 ENCOUNTER — Other Ambulatory Visit: Payer: BLUE CROSS/BLUE SHIELD

## 2015-07-19 LAB — URINALYSIS W MICROSCOPIC + REFLEX CULTURE
BILIRUBIN URINE: NEGATIVE
CRYSTALS: NONE SEEN [HPF]
Casts: NONE SEEN [LPF]
Glucose, UA: NEGATIVE
HGB URINE DIPSTICK: NEGATIVE
KETONES UR: NEGATIVE
Leukocytes, UA: NEGATIVE
Nitrite: NEGATIVE
PH: 5.5 (ref 5.0–8.0)
Protein, ur: NEGATIVE
SPECIFIC GRAVITY, URINE: 1.024 (ref 1.001–1.035)
Yeast: NONE SEEN [HPF]

## 2015-07-19 LAB — COMPREHENSIVE METABOLIC PANEL
ALK PHOS: 63 U/L (ref 33–115)
ALT: 19 U/L (ref 6–29)
AST: 18 U/L (ref 10–30)
Albumin: 4.2 g/dL (ref 3.6–5.1)
BILIRUBIN TOTAL: 0.5 mg/dL (ref 0.2–1.2)
BUN: 10 mg/dL (ref 7–25)
CO2: 30 mmol/L (ref 20–31)
Calcium: 9.1 mg/dL (ref 8.6–10.2)
Chloride: 103 mmol/L (ref 98–110)
Creat: 0.8 mg/dL (ref 0.50–1.10)
GLUCOSE: 85 mg/dL (ref 65–99)
Potassium: 4.1 mmol/L (ref 3.5–5.3)
SODIUM: 139 mmol/L (ref 135–146)
Total Protein: 6.9 g/dL (ref 6.1–8.1)

## 2015-07-19 LAB — HEMOGLOBIN A1C
Hgb A1c MFr Bld: 5.8 % — ABNORMAL HIGH (ref <5.7)
Mean Plasma Glucose: 120 mg/dL — ABNORMAL HIGH (ref <117)

## 2015-07-19 LAB — LIPID PANEL
Cholesterol: 160 mg/dL (ref 125–200)
HDL: 52 mg/dL (ref >=46)
LDL CALC: 89 mg/dL (ref <130)
TRIGLYCERIDES: 96 mg/dL (ref <150)
Total CHOL/HDL Ratio: 3.1 Ratio (ref <=5.0)
VLDL: 19 mg/dL (ref <30)

## 2015-07-19 LAB — TSH: TSH: 1.333 u[IU]/mL (ref 0.350–4.500)

## 2015-07-20 ENCOUNTER — Other Ambulatory Visit: Payer: Self-pay | Admitting: Anesthesiology

## 2015-07-20 DIAGNOSIS — R7309 Other abnormal glucose: Secondary | ICD-10-CM

## 2015-07-20 LAB — CBC WITH DIFFERENTIAL/PLATELET
BASOS ABS: 0 10*3/uL (ref 0.0–0.1)
Basophils Relative: 0 % (ref 0–1)
EOS ABS: 0.1 10*3/uL (ref 0.0–0.7)
EOS PCT: 2 % (ref 0–5)
HCT: 38.6 % (ref 36.0–46.0)
Hemoglobin: 12.6 g/dL (ref 12.0–15.0)
LYMPHS ABS: 2.2 10*3/uL (ref 0.7–4.0)
LYMPHS PCT: 34 % (ref 12–46)
MCH: 28.6 pg (ref 26.0–34.0)
MCHC: 32.6 g/dL (ref 30.0–36.0)
MCV: 87.7 fL (ref 78.0–100.0)
MONO ABS: 0.5 10*3/uL (ref 0.1–1.0)
MPV: 10.6 fL (ref 8.6–12.4)
Monocytes Relative: 8 % (ref 3–12)
Neutro Abs: 3.6 10*3/uL (ref 1.7–7.7)
Neutrophils Relative %: 56 % (ref 43–77)
PLATELETS: 335 10*3/uL (ref 150–400)
RBC: 4.4 MIL/uL (ref 3.87–5.11)
RDW: 14.2 % (ref 11.5–15.5)
WBC: 6.5 10*3/uL (ref 4.0–10.5)

## 2015-07-20 NOTE — Telephone Encounter (Signed)
appointment 07/27/15 with Dr.Ellison

## 2015-07-21 LAB — URINE CULTURE: Colony Count: >=100000

## 2015-07-23 ENCOUNTER — Other Ambulatory Visit: Payer: BLUE CROSS/BLUE SHIELD

## 2015-07-23 DIAGNOSIS — R7309 Other abnormal glucose: Secondary | ICD-10-CM

## 2015-07-24 ENCOUNTER — Other Ambulatory Visit: Payer: Self-pay | Admitting: Anesthesiology

## 2015-07-24 LAB — GLUCOSE, FASTING: GLUCOSE, FASTING: 79 mg/dL (ref 65–99)

## 2015-07-24 MED ORDER — NITROFURANTOIN MONOHYD MACRO 100 MG PO CAPS: 100.0000 mg | ORAL_CAPSULE | Freq: Two times a day (BID) | ORAL | Status: DC

## 2015-07-25 ENCOUNTER — Encounter: Payer: Self-pay | Admitting: Gynecology

## 2015-07-25 ENCOUNTER — Other Ambulatory Visit: Payer: Self-pay | Admitting: Surgery

## 2015-07-25 ENCOUNTER — Ambulatory Visit (INDEPENDENT_AMBULATORY_CARE_PROVIDER_SITE_OTHER): Payer: BLUE CROSS/BLUE SHIELD | Admitting: Gynecology

## 2015-07-25 VITALS — BP 130/90

## 2015-07-25 DIAGNOSIS — Z30430 Encounter for insertion of intrauterine contraceptive device: Secondary | ICD-10-CM | POA: Diagnosis not present

## 2015-07-25 DIAGNOSIS — Z975 Presence of (intrauterine) contraceptive device: Secondary | ICD-10-CM | POA: Insufficient documentation

## 2015-07-25 DIAGNOSIS — D251 Intramural leiomyoma of uterus: Secondary | ICD-10-CM

## 2015-07-25 NOTE — Progress Notes (Signed)
   39 y.o. for annual gyn exam who is now a gravida 2 para 2 who in September 9 delivered via cesarean section as a result of a macrosomic baby. Patient has been breast-feeding and was seen here in the office for her annual exam on November 9 and wanted to look at alternative forms of contraception and she has decided to proceed with a Mirena IUD for which she is here today. She is currently on her cycle. She scheduled later today to have a lipoma removed by the general surgeon. Patient has informed me that during her pregnancy the ultrasound demonstrated she had a fibroid uterus.                                                                    IUD procedure note       Patient presented to the office today for placement of Mirena IUD. The patient had previously been provided with literature information on this method of contraception. The risks benefits and pros and cons were discussed and all her questions were answered. She is fully aware that this form of contraception is 99% effective and is good for 5 years.  Pelvic exam: Bartholin urethra Skene glands: Within normal limits Vagina: No lesions or discharge Cervix: No lesions or discharge Uterus: Anteverted position Adnexa: No masses or tenderness Rectal exam: Not done  The cervix was cleansed with Betadine solution. A single-tooth tenaculum was placed on the anterior cervical lip. The uterus sounded to a  centimeter. The IUD was shown to the patient and inserted in a sterile fashion. The IUD string was trimmed. The single-tooth tenaculum was removed. Patient was instructed to return back to the office in one month for follow up along with ultrasound to assess fibroid uterus:  Mirena IUD lot number [id number]

## 2015-07-26 ENCOUNTER — Ambulatory Visit: Payer: BLUE CROSS/BLUE SHIELD | Admitting: Gynecology

## 2015-07-27 ENCOUNTER — Ambulatory Visit: Payer: BLUE CROSS/BLUE SHIELD | Admitting: Endocrinology

## 2015-08-08 ENCOUNTER — Other Ambulatory Visit: Payer: Self-pay

## 2015-08-08 ENCOUNTER — Encounter: Payer: Self-pay | Admitting: Endocrinology

## 2015-08-08 ENCOUNTER — Ambulatory Visit (INDEPENDENT_AMBULATORY_CARE_PROVIDER_SITE_OTHER): Payer: BLUE CROSS/BLUE SHIELD | Admitting: Endocrinology

## 2015-08-08 VITALS — BP 126/84 | HR 77 | Temp 98.6°F | Ht 73.0 in | Wt 273.0 lb

## 2015-08-08 DIAGNOSIS — K912 Postsurgical malabsorption, not elsewhere classified: Secondary | ICD-10-CM | POA: Insufficient documentation

## 2015-08-08 MED ORDER — ACARBOSE 25 MG PO TABS
25.0000 mg | ORAL_TABLET | Freq: Three times a day (TID) | ORAL | Status: DC
Start: 2015-08-08 — End: 2015-12-10

## 2015-08-08 NOTE — Patient Instructions (Signed)
i have sent a prescription to your pharmacy, to steady the blood sugar.  A side effect is stomach bloating.  This goes away with time.  If this happens, start with 1 pill per day, then slowly increase.   Please see a dietician specialist.   Please come back for a follow-up appointment in 3 months.

## 2015-08-08 NOTE — Progress Notes (Signed)
Subjective:    Patient ID: Monica Buchanan, female    DOB: [birth year], 39 y.o.   MRN: [id number]  HPI Pt had menarche at age 79.  She has always had regular menses. She is G2P2 (C-section in [place]).  During the pregnancy, she had intermittent hypoglycemia (40-60: this happened 2 hrs after a meal).  She little if any hair growth on the face, but she has assoc weight gain.  She does not want any more pregnancies (she has IUD).  She took oral contraceptives from 1992-2001.  She has gastric bypass surgery in 2008.  Since then, she has lost 100 lbs.   Past Medical History  Diagnosis Date  . Decreased fetal movement 05/08/2015  . H/O infertility   . Anemia   . Medical history non-contributory   . Diabetes mellitus without complication ([place])     gestational per OR consent order    Past Surgical History  Procedure Laterality Date  . Abdominal surgery  MAY 2008    GASTRIC BYPASS  . Cesarean section      x1  . Knee surgery Right   . Cesarean section N/A 05/18/2015    Procedure: Repeat CESAREAN SECTION;  Surgeon: Servando Salina, MD;  Location: [facility];  Service: Obstetrics;  Laterality: N/A;  EDD: 05/21/15    Social History   Social History  . Marital Status: Divorced    Spouse Name: N/A  . Number of Children: N/A  . Years of Education: N/A   Occupational History  . Not on file.   Social History Main Topics  . Smoking status: Former Smoker    Types: Cigarettes    Quit date: 03/14/2014  . Smokeless tobacco: Never Used  . Alcohol Use: No  . Drug Use: No  . Sexual Activity: Yes   Other Topics Concern  . Not on file   Social History Narrative    Current Outpatient Prescriptions on File Prior to Visit  Medication Sig Dispense Refill  . ferrous sulfate 325 (65 FE) MG tablet Take 325 mg by mouth daily with breakfast.    . folic acid (FOLVITE) 1 MG tablet Take 1 mg by mouth daily.    . Prenatal Multivit-Min-Fe-FA (PRENATAL VITAMINS PO) Take 1 tablet by mouth daily.     .  cephALEXin (KEFLEX) 500 MG capsule Take 500 mg by mouth 4 (four) times daily.    . nitrofurantoin, macrocrystal-monohydrate, (MACROBID) 100 MG capsule Take 1 capsule (100 mg total) by mouth 2 (two) times daily. (Patient not taking: Reported on 07/25/2015) 14 capsule 0  . oxyCODONE-acetaminophen (PERCOCET/ROXICET) 5-325 MG per tablet Take 1 tablet by mouth every 4 (four) hours as needed (for pain scale 4-7). (Patient not taking: Reported on 07/18/2015) 30 tablet 0   No current facility-administered medications on file prior to visit.    No Known Allergies  Family History  Problem Relation Age of Onset  . Diabetes Mother   . Hypertension Father   . Diabetes Maternal Grandmother   . Diabetes Paternal Grandmother   . Polycystic ovary syndrome Neg Hx     BP 126/84 mmHg  Pulse 77  Temp(Src) 98.6 F (37 C) (Oral)  Ht 6\' 1"  (1.854 m)  Wt 273 lb (123.832 kg)  BMI 36.03 kg/m2  SpO2 97%  LMP 07/24/2015   Review of Systems denies headache, excessive diaphoresis, polyuria, sob, hyperpigmentation, cramps, numbness, easy bruising, depression, and rash on the abdomen.  She has regained 50 lbs over the past few years.  Heavy menses  has resumed since childbirth.  She has hair loss on the head.     Objective:   Physical Exam VS: see vs page GEN: no distress HEAD: head: no deformity.  Mild terminal hair on the face.  eyes: no periorbital swelling, no proptosis.   external nose and ears are normal.  mouth: no lesion seen NECK: supple, thyroid is not enlarged CHEST WALL: no deformity LUNGS:  Clear to auscultation.   CV: reg rate and rhythm, no murmur ABD: abdomen is soft, nontender.  no hepatosplenomegaly.  not distended.  no hernia.  No striae MUSCULOSKELETAL: muscle bulk and strength are grossly normal.  no obvious joint swelling.  gait is normal and steady EXTEMITIES: no deformity.  no ulcer on the feet.  feet are of normal color and temp.  no edema PULSES: dorsalis pedis intact bilat.  no  carotid bruit NEURO:  cn 2-12 grossly intact.   readily moves all 4's.  sensation is intact to touch on the feet SKIN:  Normal texture and temperature.  No rash or suspicious lesion is visible.   NODES:  None palpable at the neck PSYCH: alert, well-oriented.  Does not appear anxious nor depressed.  Lab Results  Component Value Date   HGBA1C 5.8* 07/19/2015   I have reviewed outside records, and summarized: Pt was noted to have elevated a1c and hypoglycemia, and referred here.     Assessment & Plan:  Reactive hypoglycemia, new to me.  This, along with a1c indicates high risk for developing DM. Bariatric surg, by hx.  This can exacerbate hypoglycemia, but it is overall of great benefit to her health.   PCO: bariatric surgery helps this, also.     Patient is advised the following: Patient Instructions  i have sent a prescription to your pharmacy, to steady the blood sugar.  A side effect is stomach bloating.  This goes away with time.  If this happens, start with 1 pill per day, then slowly increase.   Please see a dietician specialist.   Please come back for a follow-up appointment in 3 months.

## 2015-08-27 ENCOUNTER — Ambulatory Visit (INDEPENDENT_AMBULATORY_CARE_PROVIDER_SITE_OTHER): Payer: BLUE CROSS/BLUE SHIELD

## 2015-08-27 ENCOUNTER — Ambulatory Visit (INDEPENDENT_AMBULATORY_CARE_PROVIDER_SITE_OTHER): Payer: BLUE CROSS/BLUE SHIELD | Admitting: Gynecology

## 2015-08-27 ENCOUNTER — Encounter: Payer: Self-pay | Admitting: Gynecology

## 2015-08-27 ENCOUNTER — Other Ambulatory Visit: Payer: Self-pay | Admitting: Gynecology

## 2015-08-27 VITALS — BP 110/70

## 2015-08-27 DIAGNOSIS — N921 Excessive and frequent menstruation with irregular cycle: Secondary | ICD-10-CM | POA: Diagnosis not present

## 2015-08-27 DIAGNOSIS — Z30431 Encounter for routine checking of intrauterine contraceptive device: Secondary | ICD-10-CM

## 2015-08-27 DIAGNOSIS — D251 Intramural leiomyoma of uterus: Secondary | ICD-10-CM

## 2015-08-27 DIAGNOSIS — IMO0001 Reserved for inherently not codable concepts without codable children: Secondary | ICD-10-CM

## 2015-08-27 DIAGNOSIS — T8383XA Hemorrhage of genitourinary prosthetic devices, implants and grafts, initial encounter: Secondary | ICD-10-CM | POA: Diagnosis not present

## 2015-08-27 DIAGNOSIS — N923 Ovulation bleeding: Secondary | ICD-10-CM

## 2015-08-27 DIAGNOSIS — Z30430 Encounter for insertion of intrauterine contraceptive device: Secondary | ICD-10-CM

## 2015-08-27 LAB — CBC WITH DIFFERENTIAL/PLATELET
Basophils Absolute: 0 10*3/uL (ref 0.0–0.1)
Basophils Relative: 0 % (ref 0–1)
EOS ABS: 0.1 10*3/uL (ref 0.0–0.7)
Eosinophils Relative: 1 % (ref 0–5)
HEMATOCRIT: 38 % (ref 36.0–46.0)
HEMOGLOBIN: 12 g/dL (ref 12.0–15.0)
LYMPHS ABS: 2.3 10*3/uL (ref 0.7–4.0)
Lymphocytes Relative: 29 % (ref 12–46)
MCH: 27.5 pg (ref 26.0–34.0)
MCHC: 31.6 g/dL (ref 30.0–36.0)
MCV: 87 fL (ref 78.0–100.0)
MONOS PCT: 8 % (ref 3–12)
MPV: 9.8 fL (ref 8.6–12.4)
Monocytes Absolute: 0.6 10*3/uL (ref 0.1–1.0)
NEUTROS PCT: 62 % (ref 43–77)
Neutro Abs: 4.8 10*3/uL (ref 1.7–7.7)
PLATELETS: 362 10*3/uL (ref 150–400)
RBC: 4.37 MIL/uL (ref 3.87–5.11)
RDW: 14.5 % (ref 11.5–15.5)
WBC: 7.8 10*3/uL (ref 4.0–10.5)

## 2015-08-27 NOTE — Progress Notes (Signed)
   Patient is a 39 year old who was seen in the office on November 16 for placement of Mirena IUD. She is a gravida 2 para 2 who in September 9 delivered via cesarean section as a result of a macrosomic baby. Patient has been breast-feeding. She reports intermittent spotting since the Mirena IUD and at times has felt tired. Review of her record indicated that she had a normal CBC on November 10. She had been referred to Dr. Loanne Drilling endocrinologist who was monitoring her sugars.  Ultrasound today: Uterus measured 8.2 x 6.8 x 4.2 cm with endometrial stripe at 2.8 mm. Previous fibroid 34mm not seen, intramural fibroid measured 6 x 8 mm was noted. Right and left ovary were normal. Minimal fluid in the cul-de-sac. IUD was seen in the proper position in the uterine cavity.  Assessment/plan: Intermittent spotting after placement of Mirena IUD. Patient was reassured. Patient to continue to monitor for at least 2 more months. We'll check her CBC otherwise we'll see her back next year for annual exam or when necessary. She was reminded to follow-up with her endocrinologist.

## 2015-11-06 ENCOUNTER — Ambulatory Visit: Payer: BLUE CROSS/BLUE SHIELD | Admitting: Endocrinology

## 2015-11-08 ENCOUNTER — Ambulatory Visit: Payer: BLUE CROSS/BLUE SHIELD | Admitting: Dietician

## 2015-11-08 ENCOUNTER — Encounter: Payer: Self-pay | Admitting: Gynecology

## 2015-11-08 ENCOUNTER — Ambulatory Visit (INDEPENDENT_AMBULATORY_CARE_PROVIDER_SITE_OTHER): Payer: BLUE CROSS/BLUE SHIELD | Admitting: Gynecology

## 2015-11-08 VITALS — BP 120/78

## 2015-11-08 DIAGNOSIS — Z8639 Personal history of other endocrine, nutritional and metabolic disease: Secondary | ICD-10-CM | POA: Diagnosis not present

## 2015-11-08 DIAGNOSIS — R5383 Other fatigue: Secondary | ICD-10-CM | POA: Diagnosis not present

## 2015-11-08 DIAGNOSIS — N921 Excessive and frequent menstruation with irregular cycle: Secondary | ICD-10-CM

## 2015-11-08 DIAGNOSIS — Z975 Presence of (intrauterine) contraceptive device: Principal | ICD-10-CM

## 2015-11-08 LAB — CBC WITH DIFFERENTIAL/PLATELET
BASOS ABS: 0 10*3/uL (ref 0.0–0.1)
Basophils Relative: 0 % (ref 0–1)
EOS ABS: 0.1 10*3/uL (ref 0.0–0.7)
EOS PCT: 1 % (ref 0–5)
HEMATOCRIT: 38.1 % (ref 36.0–46.0)
Hemoglobin: 12.1 g/dL (ref 12.0–15.0)
LYMPHS ABS: 1.9 10*3/uL (ref 0.7–4.0)
LYMPHS PCT: 33 % (ref 12–46)
MCH: 27.6 pg (ref 26.0–34.0)
MCHC: 31.8 g/dL (ref 30.0–36.0)
MCV: 86.8 fL (ref 78.0–100.0)
MONO ABS: 0.5 10*3/uL (ref 0.1–1.0)
MONOS PCT: 8 % (ref 3–12)
MPV: 10 fL (ref 8.6–12.4)
Neutro Abs: 3.4 10*3/uL (ref 1.7–7.7)
Neutrophils Relative %: 58 % (ref 43–77)
PLATELETS: 389 10*3/uL (ref 150–400)
RBC: 4.39 MIL/uL (ref 3.87–5.11)
RDW: 14.3 % (ref 11.5–15.5)
WBC: 5.9 10*3/uL (ref 4.0–10.5)

## 2015-11-08 LAB — HEMOGLOBIN A1C
HEMOGLOBIN A1C: 6 % — AB (ref <5.7)
Mean Plasma Glucose: 126 mg/dL — ABNORMAL HIGH (ref <117)

## 2015-11-08 NOTE — Progress Notes (Signed)
   Patient is a 40 year old woman November 16 had a Mirena IUD placed she is a gravida 2 para 2 who delivered via cesarean section September 9 as a result of macrosomic baby. She's currently breast-feeding and is here because complaining of intermenstrual spotting fatigue. She also had been referred to Dr. Loanne Drilling  Endocrinologist as a result of her hypoglycemic episodes in the past. She had gastric bypass in 2008 and has lost 100 pounds. According to Dr. Cordelia Pen notes her reactive hypoglycemia could place her in risk for developing diabetes mellitus. He had prescribed her Acarbose which she is not taking. She states she is been monitoring her blood sugars fingerstick at home and she averages 80-82 and 2 hour postprandial less than 100.  She states that she has a menstrual cycle every month for the first 5-6 days the role heavy and then spotting for 2 or 3 days afterwards.  Exam: Abdomen: Soft nontender no rebound or guarding Back: No CVA tenderness Pelvic: Bartholin urethra Skene was within normal limits Vagina: No lesions or discharge Cervix: IUD string was visualized Uterus: Anteverted normal size shape and consistency Adnexa: No palpable mass or tenderness Rectal exam not done  Assessment/plan: Patient's intermenstrual spotting is more towards the end of her menstrual cycle although for total 7-10 days. She was provided with options either to remove the IUD and start her only on a progesterone only oral contraceptive pill since she's breast-feeding or barrier contraception such as condoms. She has decided to wait it out for 3 more months. We will check her CBC today and also will check her hemoglobin A1c as well.

## 2015-11-09 ENCOUNTER — Ambulatory Visit: Payer: BLUE CROSS/BLUE SHIELD | Admitting: Gynecology

## 2015-12-07 ENCOUNTER — Emergency Department (HOSPITAL_COMMUNITY)
Admission: EM | Admit: 2015-12-07 | Discharge: 2015-12-07 | Disposition: A | Discharge status: Home / Self Care | Payer: BLUE CROSS/BLUE SHIELD | Source: Home / Self Care | Attending: Emergency Medicine | Admitting: Emergency Medicine

## 2015-12-07 ENCOUNTER — Encounter (HOSPITAL_COMMUNITY): Payer: Self-pay | Admitting: Emergency Medicine

## 2015-12-07 DIAGNOSIS — W57XXXA Bitten or stung by nonvenomous insect and other nonvenomous arthropods, initial encounter: Secondary | ICD-10-CM | POA: Diagnosis not present

## 2015-12-07 DIAGNOSIS — T148 Other injury of unspecified body region: Secondary | ICD-10-CM | POA: Diagnosis not present

## 2015-12-07 MED ORDER — TRIAMCINOLONE ACETONIDE 0.1 % EX CREA: 1.0000 "application " | TOPICAL_CREAM | Freq: Two times a day (BID) | CUTANEOUS | Status: DC

## 2015-12-07 NOTE — Discharge Instructions (Signed)
I can't tell you what, but something bit you. Use the triamcinolone cream twice a day for the next week. Please start your Zyrtec. This will help with itching as well as the phlegm and hoarseness. Follow-up as needed.  No puedo decirte qu, pero algo te mordi. Use la crema de triamcinolona dos veces al da durante la semana siguiente. Inicie su Zyrtec. Esto ayudar con la picazn, as como la flema y la ronquera. Seguimiento segn sea necesario.

## 2015-12-07 NOTE — ED Notes (Signed)
C/o rash on left arm onset x10 days... Believes it may be due to spider bites  Reports she is breast feeding A&O x4... No acute distress.

## 2015-12-07 NOTE — ED Provider Notes (Signed)
CSN: [id number]     Arrival date & time 12/07/15  1302 History   First MD Initiated Contact with Patient 12/07/15 1337     Chief Complaint  Patient presents with  . Rash   (Consider location/radiation/quality/duration/timing/severity/associated sxs/prior Treatment) HPI She is a 40 year old woman here for evaluation of left elbow rash. A Spanish interpreter was used for this encounter. She states for the last 10 days she has had what she thinks are bug bites on her left posterior elbow. They have been getting progressively larger, redder, and more itchy. She has not seen anything bite her, but states she has killed several spiders in the home. She has not used any medications. She also reports about a week of intermittent runny nose and hoarseness with frequent throat clearing. She has purchased Zyrtec at the store, but hasn't started it yet. She is breast-feeding.  Past Medical History  Diagnosis Date  . Decreased fetal movement 05/08/2015  . H/O infertility   . Anemia   . Medical history non-contributory   . Diabetes mellitus without complication ([place])     gestational per OR consent order   Past Surgical History  Procedure Laterality Date  . Abdominal surgery  MAY 2008    GASTRIC BYPASS  . Cesarean section      x1  . Knee surgery Right   . Cesarean section N/A 05/18/2015    Procedure: Repeat CESAREAN SECTION;  Surgeon: Servando Salina, MD;  Location: [facility];  Service: Obstetrics;  Laterality: N/A;  EDD: 05/21/15   Family History  Problem Relation Age of Onset  . Diabetes Mother   . Hypertension Father   . Diabetes Maternal Grandmother   . Diabetes Paternal Grandmother   . Polycystic ovary syndrome Neg Hx    Social History  Substance Use Topics  . Smoking status: Former Smoker    Types: Cigarettes    Quit date: 03/14/2014  . Smokeless tobacco: Never Used  . Alcohol Use: No   OB History    Gravida Para Term Preterm AB TAB SAB Ectopic Multiple Living   2 2 2  0 0 0 0 0 0 2      Review of Systems As in history of present illness Allergies  Review of patient's allergies indicates no known allergies.  Home Medications   Prior to Admission medications   Medication Sig Start Date End Date Taking? Authorizing Provider  cephALEXin (KEFLEX) 500 MG capsule Take 500 mg by mouth 4 (four) times daily. Reported on 11/08/2015    Historical Provider, MD  ferrous sulfate 325 (65 FE) MG tablet Take 325 mg by mouth daily with breakfast. Reported on 11/08/2015    Historical Provider, MD  folic acid (FOLVITE) 1 MG tablet Take 1 mg by mouth daily. Reported on 11/08/2015    Historical Provider, MD  Prenatal Multivit-Min-Fe-FA (PRENATAL VITAMINS PO) Take 1 tablet by mouth daily. Reported on 11/08/2015    Historical Provider, MD  triamcinolone cream (KENALOG) 0.1 % Apply 1 application topically 2 (two) times daily. 12/07/15   Melony Overly, MD   Meds Ordered and Administered this Visit  Medications - No data to display  BP 122/85 mmHg  Pulse 65  Temp(Src) 98.4 F (36.9 C) (Oral)  Resp 18  SpO2 96%  LMP 10/28/2015  Breastfeeding? Yes No data found.   Physical Exam  Constitutional: She is oriented to person, place, and time. She appears well-developed and well-nourished. No distress.  HENT:  Clear postnasal drainage seen  Neck: Neck supple.  Cardiovascular: Normal rate.   Pulmonary/Chest: Effort normal.  Neurological: She is alert and oriented to person, place, and time.  Skin: Rash (5 erythematous wheals on the left posterior elbow. 3 are quite small, 2 are larger.) noted.    ED Course  Procedures (including critical care time)  Labs Review Labs Reviewed - No data to display  Imaging Review No results found.   MDM   1. Bug bites    Triamcinolone cream for the bug bites. Recommended starting the Zyrtec to help with itching as well as the hoarseness and phlegm. Follow-up as needed.    Melony Overly, MD 12/07/15 [phone]

## 2015-12-09 NOTE — Patient Instructions (Addendum)
i have sent a prescription to your pharmacy, to refill the medication, to steady the blood sugar.  A side effect is stomach bloating.  This goes away with time.  If this happens, start with 1 pill per day, then slowly increase.   Please consider having another weight loss surgery.  It is good for your health.  Here is some information about it.  If you decide to consider further, please call the phone number in the papers, and register for a free informational meeting Please come back for a follow-up appointment in 4 months.

## 2015-12-09 NOTE — Progress Notes (Signed)
Subjective:    Patient ID: Monica Buchanan, female    DOB: 1976/02/23, 40 y.o.   MRN: [id number]  HPI Pt returns for f/u of reactive hypoglycemia; (she is G2P2 (C-section in [place]); during the pregnancy, she had intermittent reactive hypoglycemia; she does not want any more pregnancies (she has IUD); she has gastric bypass surgery in 2008, in [place]).  Since then, she has lost 100 lbs.  She took acarbose x 3 days only, then stopped (but she had no side-effect).  She reports slight tremor of the hands, but no assoc palpitations.  This happens approx once per week, usually 2 hrs after breakfast.  Past Medical History  Diagnosis Date  . Decreased fetal movement 05/08/2015  . H/O infertility   . Anemia   . Medical history non-contributory   . Diabetes mellitus without complication ([place])     gestational per OR consent order    Past Surgical History  Procedure Laterality Date  . Abdominal surgery  MAY 2008    GASTRIC BYPASS  . Cesarean section      x1  . Knee surgery Right   . Cesarean section N/A 05/18/2015    Procedure: Repeat CESAREAN SECTION;  Surgeon: Servando Salina, MD;  Location: [facility];  Service: Obstetrics;  Laterality: N/A;  EDD: 05/21/15    Social History   Social History  . Marital Status: Divorced    Spouse Name: N/A  . Number of Children: N/A  . Years of Education: N/A   Occupational History  . Not on file.   Social History Main Topics  . Smoking status: Former Smoker    Types: Cigarettes    Quit date: 03/14/2014  . Smokeless tobacco: Never Used  . Alcohol Use: No  . Drug Use: No  . Sexual Activity: Yes   Other Topics Concern  . Not on file   Social History Narrative    Current Outpatient Prescriptions on File Prior to Visit  Medication Sig Dispense Refill  . triamcinolone cream (KENALOG) 0.1 % Apply 1 application topically 2 (two) times daily. 30 g 0  . ferrous sulfate 325 (65 FE) MG tablet Take 325 mg by mouth daily with breakfast. Reported on  [id number]    . folic acid (FOLVITE) 1 MG tablet Take 1 mg by mouth daily. Reported on 12/10/2015    . Prenatal Multivit-Min-Fe-FA (PRENATAL VITAMINS PO) Take 1 tablet by mouth daily. Reported on 12/10/2015     No current facility-administered medications on file prior to visit.    No Known Allergies  Family History  Problem Relation Age of Onset  . Diabetes Mother   . Hypertension Father   . Diabetes Maternal Grandmother   . Diabetes Paternal Grandmother   . Polycystic ovary syndrome Neg Hx     BP 132/70 mmHg  Pulse 85  Temp(Src) 98 F (36.7 C) (Oral)  Ht 6\' 1"  (1.854 m)  Wt 278 lb (126.1 kg)  BMI 36.69 kg/m2  SpO2 97%  Review of Systems Denies excessive diaphoresis.  She has gained a few lbs.      Objective:   Physical Exam VITAL SIGNS:  See vs page GENERAL: no distress Ext: no edema.    Lab Results  Component Value Date   HGBA1C 6.0* 11/08/2015      Assessment & Plan:  Hyperglycemia: worse.  Reactive hypoglycemia, persistent.  Morbid obesity, slightly worse.   Patient is advised the following: Patient Instructions  i have sent a prescription to your pharmacy, to refill the  medication, to steady the blood sugar.  A side effect is stomach bloating.  This goes away with time.  If this happens, start with 1 pill per day, then slowly increase.   Please consider having another weight loss surgery.  It is good for your health.  Here is some information about it.  If you decide to consider further, please call the phone number in the papers, and register for a free informational meeting Please come back for a follow-up appointment in 4 months.

## 2015-12-10 ENCOUNTER — Encounter: Payer: Self-pay | Admitting: Endocrinology

## 2015-12-10 ENCOUNTER — Ambulatory Visit (INDEPENDENT_AMBULATORY_CARE_PROVIDER_SITE_OTHER): Payer: BLUE CROSS/BLUE SHIELD | Admitting: Endocrinology

## 2015-12-10 ENCOUNTER — Other Ambulatory Visit: Payer: Self-pay

## 2015-12-10 VITALS — BP 132/70 | HR 85 | Temp 98.0°F | Ht 73.0 in | Wt 278.0 lb

## 2015-12-10 DIAGNOSIS — E161 Other hypoglycemia: Secondary | ICD-10-CM | POA: Insufficient documentation

## 2015-12-10 DIAGNOSIS — E162 Hypoglycemia, unspecified: Secondary | ICD-10-CM | POA: Diagnosis not present

## 2015-12-10 MED ORDER — BAYER MICROLET LANCETS MISC: Status: DC

## 2015-12-10 MED ORDER — ACARBOSE 25 MG PO TABS: 25.0000 mg | ORAL_TABLET | Freq: Three times a day (TID) | ORAL | Status: DC

## 2015-12-10 MED ORDER — GLUCOSE BLOOD VI STRP: ORAL_STRIP | Status: DC

## 2015-12-13 ENCOUNTER — Encounter: Payer: BLUE CROSS/BLUE SHIELD | Attending: Endocrinology | Admitting: Dietician

## 2015-12-13 ENCOUNTER — Encounter: Payer: Self-pay | Admitting: Dietician

## 2015-12-13 VITALS — Ht 73.0 in | Wt 286.0 lb

## 2015-12-13 DIAGNOSIS — K912 Postsurgical malabsorption, not elsewhere classified: Secondary | ICD-10-CM

## 2015-12-13 DIAGNOSIS — E119 Type 2 diabetes mellitus without complications: Secondary | ICD-10-CM

## 2015-12-13 NOTE — Patient Instructions (Signed)
Plan:  Always eat breakfast Aim for 4 Carb Choices per meal (60 grams) +/- 1 either way  Aim for 0-2 Carbs per snack if hungry  Include protein in moderation with your meals and snacks Consider reading food labels for Total Carbohydrate and Fat Grams of foods Consider  increasing your activity level by walking or zumba or other exercise you enjoy for 30-60 minutes daily as tolerated Sit down when you eat and enjoy.  Stop when you are full.

## 2015-12-13 NOTE — Progress Notes (Signed)
Medical Nutrition Therapy:  Appt start time: 1300 end time:  1410.   Assessment:  Primary concerns today: Patient is here alone.  She states that she doe not like vegetables and craves increased sugar, desserts, and regular soda.  She states that her [facility] was [facility].  She states that the whole family does not eat vegetables and would like to learn how to like them.  Her referral is for post GI surgery hypoglycemia.  Hx includes gastric bypass surgery in 2008 in [place].  She lost from 370 lbs to 223 lbs.  Her preferred weight is 220 lbs.  Today's weight is 286 lbs.  She has a 41 month old son.  She weighed 274 lbs pre pregnancy, 301 lbs post birth, and 274 lbs 3 weeks ago.  She is breastfeeding.  Other hx includes gestational diabetes.  HgbA1C was 6.0% 11/08/15 and 5.8% 07/19/15.  She is concerned about diabetes because both grandmothers and mother have this.  Patient lives with her boyfriend, 41 yo son, and 10 month old son.  She does the shopping and her boyfriend cooks.  He cooks healthy at times.  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  Preferred Learning Style:   No preference indicated   Learning Readiness:   Ready  MEDICATIONS: see list to include precose (acarbose)   DIETARY INTAKE:  Usual eating pattern includes 2-3 meals and 2-3 snacks per day.  Everyday foods include beans and rice and meat.  Avoided foods include all vegetables except lettuce and tomato.    24-hr recall:  B ( AM): [place]opas or tortilla or bread with juice or skips because it is too early Snk ( AM): yogurt or cookies or crackers or nuts  L (11:00-11:30 PM): rice, beans, meat OR pasta and meat Snk ( PM): chips or other items from vending machine or melon or orange D (6 PM): rice, beans, meat OR pasta and meat Snk ( PM): melon or orange Beverages: regular soda "all day" (32 oz) per day, juice, flavored water, coffee with 2 T sugar  Usual  physical activity: none now.  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 2010 was the last time she played for a tournament.  For the past 3 years she has not exercised.  Estimated energy needs: 2000 calories 225 g carbohydrates 125 g protein 67 g fat  Progress Towards Goal(s):  In progress.   Nutritional Diagnosis:  NB-1.1 Food and nutrition-related knowledge deficit As related to balance of carbohydrate, protein, and fat.  As evidenced by diet hx.    Intervention:  Nutrition counseling and diabetes education initiated. Discussed Carb Counting by food group as method of portion control, reading food labels, and benefits of increased activity. Discussed this in relationship to low blood sugar symptoms.  Discussed the importance of changing her beverage intake and avoiding refined sugars to help stabilize her blood sugar.  Guidelines appropriate for weight loss.  Plan:  Always eat breakfast Aim for 4 Carb Choices per meal (60 grams) +/- 1 either way  Aim for 0-2 Carbs per snack if hungry  Include protein in moderation with your meals and snacks Consider reading food labels for Total Carbohydrate and Fat Grams of foods Consider  increasing your activity level by walking or zumba or other exercise you enjoy for 30-60 minutes daily as tolerated Sit down when you eat and enjoy.  Stop when you are full.  Teaching Method Utilized:  Visual Auditory Hands on  Handouts given during visit include:  Meal plan card (Spanish)  My plate (Spanish)  Planning healthy meals (Spanish)  Snack list  Label reading  Barriers to learning/adherence to lifestyle change: none  Demonstrated degree of understanding via:  Teach Back   Monitoring/Evaluation:  Dietary intake, exercise, label reading, and body weight prn.

## 2016-04-04 ENCOUNTER — Ambulatory Visit: Payer: BLUE CROSS/BLUE SHIELD | Admitting: Endocrinology

## 2016-04-04 DIAGNOSIS — Z0289 Encounter for other administrative examinations: Secondary | ICD-10-CM

## 2016-07-21 ENCOUNTER — Ambulatory Visit (INDEPENDENT_AMBULATORY_CARE_PROVIDER_SITE_OTHER): Payer: BLUE CROSS/BLUE SHIELD | Admitting: Gynecology

## 2016-07-21 ENCOUNTER — Other Ambulatory Visit: Payer: Self-pay | Admitting: Gynecology

## 2016-07-21 ENCOUNTER — Encounter: Payer: Self-pay | Admitting: Gynecology

## 2016-07-21 VITALS — BP 128/86 | Ht 73.0 in | Wt 290.0 lb

## 2016-07-21 DIAGNOSIS — Z01411 Encounter for gynecological examination (general) (routine) with abnormal findings: Secondary | ICD-10-CM

## 2016-07-21 DIAGNOSIS — K2100 Gastro-esophageal reflux disease with esophagitis, without bleeding: Secondary | ICD-10-CM | POA: Insufficient documentation

## 2016-07-21 DIAGNOSIS — Z8639 Personal history of other endocrine, nutritional and metabolic disease: Secondary | ICD-10-CM | POA: Diagnosis not present

## 2016-07-21 DIAGNOSIS — D251 Intramural leiomyoma of uterus: Secondary | ICD-10-CM | POA: Diagnosis not present

## 2016-07-21 DIAGNOSIS — G44019 Episodic cluster headache, not intractable: Secondary | ICD-10-CM | POA: Diagnosis not present

## 2016-07-21 DIAGNOSIS — Z23 Encounter for immunization: Secondary | ICD-10-CM

## 2016-07-21 LAB — COMPREHENSIVE METABOLIC PANEL
ALT: 12 U/L (ref 6–29)
AST: 15 U/L (ref 10–30)
Albumin: 4.1 g/dL (ref 3.6–5.1)
Alkaline Phosphatase: 52 U/L (ref 33–115)
BILIRUBIN TOTAL: 0.4 mg/dL (ref 0.2–1.2)
BUN: 12 mg/dL (ref 7–25)
CALCIUM: 8.8 mg/dL (ref 8.6–10.2)
CO2: 27 mmol/L (ref 20–31)
CREATININE: 0.84 mg/dL (ref 0.50–1.10)
Chloride: 106 mmol/L (ref 98–110)
GLUCOSE: 87 mg/dL (ref 65–99)
Potassium: 4.5 mmol/L (ref 3.5–5.3)
SODIUM: 139 mmol/L (ref 135–146)
Total Protein: 6.9 g/dL (ref 6.1–8.1)

## 2016-07-21 LAB — CBC WITH DIFFERENTIAL/PLATELET
Basophils Absolute: 0 cells/uL (ref 0–200)
Basophils Relative: 0 %
EOS PCT: 2 %
Eosinophils Absolute: 148 cells/uL (ref 15–500)
HCT: 37.2 % (ref 35.0–45.0)
Hemoglobin: 11.6 g/dL — ABNORMAL LOW (ref 11.7–15.5)
LYMPHS PCT: 32 %
Lymphs Abs: 2368 cells/uL (ref 850–3900)
MCH: 27 pg (ref 27.0–33.0)
MCHC: 31.2 g/dL — AB (ref 32.0–36.0)
MCV: 86.7 fL (ref 80.0–100.0)
MONOS PCT: 8 %
MPV: 9.8 fL (ref 7.5–12.5)
Monocytes Absolute: 592 cells/uL (ref 200–950)
NEUTROS PCT: 58 %
Neutro Abs: 4292 cells/uL (ref 1500–7800)
PLATELETS: 417 10*3/uL — AB (ref 140–400)
RBC: 4.29 MIL/uL (ref 3.80–5.10)
RDW: 14.1 % (ref 11.0–15.0)
WBC: 7.4 10*3/uL (ref 3.8–10.8)

## 2016-07-21 LAB — LIPID PANEL
CHOL/HDL RATIO: 3.6 ratio (ref <5.0)
CHOLESTEROL: 153 mg/dL (ref <200)
HDL: 42 mg/dL — AB (ref >50)
LDL Cholesterol: 90 mg/dL (ref <100)
Triglycerides: 103 mg/dL (ref <150)
VLDL: 21 mg/dL (ref <30)

## 2016-07-21 LAB — TSH: TSH: 1.24 mIU/L

## 2016-07-21 MED ORDER — OMEPRAZOLE MAGNESIUM 20 MG PO TBEC: 20.0000 mg | DELAYED_RELEASE_TABLET | Freq: Every day | ORAL | 3 refills | Status: DC

## 2016-07-21 MED ORDER — SUMATRIPTAN SUCCINATE 50 MG PO TABS: ORAL_TABLET | ORAL | 1 refills | Status: DC

## 2016-07-21 NOTE — Addendum Note (Signed)
Addended by: Thurnell Garbe A on: 07/21/2016 12:22 PM   Modules accepted: Orders

## 2016-07-21 NOTE — Progress Notes (Signed)
RIDA BUTTREY [birth year] [id number]   History:    40 y.o.  for annual gyn exam who is a gravida 2 para 2 with several complaints: Patient for the past several days has had headaches in the morning which last for approximately an hour she reports no aura. Then yesterday she had a headache when she will cup. Review of her records indicated that she had been referred to the endocrinologist Dr.Sean Loanne Drilling as a result of her reactive hypoglycemia. She had exceeds checks her sugars and they have not dropped below 80. She is fasting today for her blood work. Her second problem is that she has esophageal reflux but states that she has severe esophagitis low midepigastric and when she tries to take foods it worsens her symptoms. Patient had a gastric bypass back in 2008. Patient has done well since she had a Mirena IUD placed in 2016. Patient does have a history of very small intramural myoma.Patient does have a history of PCOS and had conceived this pregnancy with ovulation induction medication. Patient with no previous history of any abnormal Pap smears. Patient requesting flu vaccine today as well.  Patient's mother in 2 grandmothers with insulin-dependent diabetes.  Past medical history,surgical history, family history and social history were all reviewed and documented in the EPIC chart.  Gynecologic History Patient's last menstrual period was 06/26/2016. Contraception: IUD Last Pap: 2015. Results were: normal Last mammogram: No previous study. Results were: No previous study  Obstetric History OB History  Gravida Para Term Preterm AB Living  2 2 2  0 0 2  SAB TAB Ectopic Multiple Live Births  0 0 0 0 1    # Outcome Date GA Lbr Len/2nd Weight Sex Delivery Anes PTL Lv  2 Term 05/18/15 [redacted]w[redacted]d  9 lb 5.2 oz (4.23 kg) M CS-LTranv Spinal  LIV  1 Term                ROS: A ROS was performed and pertinent positives and negatives are included in the history.  GENERAL: No fevers or chills.  HEENT: No change in vision, no earache, sore throat or sinus congestion. NECK: No pain or stiffness. CARDIOVASCULAR: No chest pain or pressure. No palpitations. PULMONARY: No shortness of breath, cough or wheeze. GASTROINTESTINAL: No abdominal pain, nausea, vomiting or diarrhea, melena or bright red blood per rectum. GENITOURINARY: No urinary frequency, urgency, hesitancy or dysuria. MUSCULOSKELETAL: No joint or muscle pain, no back pain, no recent trauma. DERMATOLOGIC: No rash, no itching, no lesions. ENDOCRINE: No polyuria, polydipsia, no heat or cold intolerance. No recent change in weight. HEMATOLOGICAL: No anemia or easy bruising or bleeding. NEUROLOGIC: No headache, seizures, numbness, tingling or weakness. PSYCHIATRIC: No depression, no loss of interest in normal activity or change in sleep pattern.     Exam: chaperone present  BP 128/86   Ht 6\' 1"  (1.854 m)   Wt 290 lb (131.5 kg)   LMP 06/26/2016   BMI 38.26 kg/m   Body mass index is 38.26 kg/m.  General appearance : Well developed well nourished female. No acute distress HEENT: Eyes: no retinal hemorrhage or exudates,  Neck supple, trachea midline, no carotid bruits, no thyroidmegaly Lungs: Clear to auscultation, no rhonchi or wheezes, or rib retractions  Heart: Regular rate and rhythm, no murmurs or gallops Breast:Examined in sitting and supine position were symmetrical in appearance, no palpable masses or tenderness,  no skin retraction, no nipple inversion, no nipple discharge, no skin discoloration, no axillary or  supraclavicular lymphadenopathy Abdomen: no palpable masses or tenderness, no rebound or guarding Extremities: no edema or skin discoloration or tenderness  Pelvic:  Bartholin, Urethra, Skene Glands: Within normal limits             Vagina: No gross lesions or discharge  Cervix: No gross lesions or discharge  Uterus  anteverted, normal size, shape and consistency, non-tender and mobile  Adnexa  Without masses or  tenderness  Anus and perineum  normal   Rectovaginal  normal sphincter tone without palpated masses or tenderness             Hemoccult not indicated     Assessment/Plan:  40 y.o. female for annual exam with eye appears to be gastroesophageal reflux but needs further evaluation by the gastroenterologist with possible endoscopy to rule out the possibility of peptic ulcer disease. In the meantime I'm going to call her in a prescription for Prilosec 20 mg daily with breakfast. Also because of her headaches have point to prescribe her Imitrex 50 mg to take 1 by mouth when necessary and may repeat in 2 hours if no improvement of her headaches but not to exceed 2 tablets in 24 hours. Her headaches may be attributed to her hypoglycemic events and we'll wait for further evaluation by her endocrinologist for which I'm encouraging her that she make an appointment for follow-up. The following screening blood work was ordered today: Fasting lipid profile, comprehensive metabolic panel, TSH, CBC, and urinalysis. Patient received the flu vaccine after she was counseled. She will need a Pap smear next year. I provided her also with a requisition to schedule her baseline mammogram.   Terrance Mass MD, 12:00 PM 07/21/2016

## 2016-07-21 NOTE — Patient Instructions (Signed)
Influenza Virus Vaccine (Flucelvax) Qu es este medicamento? La VACUNA ANTIGRIPAL ayuda a disminuir el riesgo de contraer la influenza, tambin conocida como la gripe. La vacuna solo ayuda a protegerle contra algunas cepas de influenza. Este medicamento puede ser utilizado para otros usos; si tiene alguna pregunta consulte con su proveedor de atencin mdica o con su farmacutico. Qu le debo informar a mi profesional de la salud antes de tomar este medicamento? Necesita saber si usted presenta alguno de los siguientes problemas o situaciones: -trastorno de sangrado como hemofilia -fiebre o infeccin -sndrome de Guillain-Barre u otros problemas neurolgicos -problemas del sistema inmunolgico -infeccin por el virus de la inmunodeficiencia humana (VIH) o SIDA -niveles bajos de plaquetas en la sangre -esclerosis mltiple -una reaccin alrgica o inusual a las vacunas antigripales, a otros medicamentos, alimentos, colorantes o conservantes -si est embarazada o buscando quedar embarazada -si est amamantando a un beb Cmo debo utilizar este medicamento? Esta vacuna se administra mediante inyeccin por va intramuscular. Lo administra un profesional de KB Home	[place]. Recibir una copia de informacin escrita sobre la vacuna antes de cada vacuna. Asegrese de leer este folleto cada vez cuidadosamente. Este folleto puede cambiar con frecuencia. Hable con su pediatra para informarse acerca del uso de este medicamento en nios. Puede requerir atencin especial. Sobredosis: Pngase en contacto inmediatamente con un centro toxicolgico o una sala de urgencia si usted cree que haya tomado demasiado medicamento. ATENCIN: [employer] es solo para usted. No comparta este medicamento con nadie. Qu sucede si me olvido de una dosis? No se aplica en este caso. Qu puede interactuar con este medicamento? -quimioterapia o radioterapia -medicamentos que suprimen el sistema inmunolgico, tales como  etanercept, anakinra, infliximab y adalimumab -medicamentos que tratan o previenen cogulos sanguneos, como warfarina -fenitona -medicamentos esteroideos, como la prednisona o la cortisona -teofilina -vacunas Puede ser que esta lista no menciona todas las posibles interacciones. Informe a su profesional de KB Home	[place] de [employer] Corporation productos a base de hierbas, medicamentos de [place] o suplementos nutritivos que est tomando. Si usted fuma, consume bebidas alcohlicas o si utiliza drogas ilegales, indqueselo tambin a su profesional de KB Home	[place]. Algunas sustancias pueden interactuar con su medicamento. A qu debo estar atento al usar Coca-Cola? Informe a su mdico o a Barrister's clerk de la [employer] todos los efectos secundarios que persistan despus de 3 das. Llame a su proveedor de atencin mdica si se presentan sntomas inusuales dentro de las 6 semanas de recibir esta vacuna. Es posible que todava pueda contraer la gripe, pero la enfermedad no ser tan fuerte como normalmente. No puede contraer la gripe de esta vacuna. La vacuna antigripal no le protege contra resfros u otras enfermedades que pueden causar [place]. Debe vacunarse cada ao. Qu efectos secundarios puedo tener al [employer] este medicamento? Efectos secundarios que debe informar a su mdico o a Barrister's clerk de la salud tan pronto como sea posible: -reacciones alrgicas como erupcin cutnea, picazn o urticarias, hinchazn de la cara, labios o lengua Efectos secundarios que, por lo general, no requieren atencin mdica (debe informarlos a su mdico o a su profesional de la salud si persisten o si son molestos): -fiebre -dolor de cabeza -molestias y dolores musculares -dolor, sensibilidad, enrojecimiento o Estate agent de la inyeccin -cansancio Puede ser que esta lista no menciona todos los posibles efectos secundarios. Comunquese a su mdico por asesoramiento mdico [employer]. Usted  puede informar los efectos secundarios a la FDA por telfono al 1-800-FDA-1088. Dnde  debo guardar mi medicina? Esta vacuna se administrar por un profesional de la salud en una Goessel, Engineer, mining, consultorio mdico u otro consultorio de un profesional de la salud. No se le suministrar esta vacuna para guardar en su domicilio. ATENCIN: Este folleto es un resumen. Puede ser que no cubra toda la posible informacin. Si usted tiene preguntas acerca de esta medicina, consulte con su mdico, su farmacutico o su profesional de Technical sales engineer.    2016, Elsevier/Gold Standard. (2011-08-11 16:29:16) Omeprazole capsules (sprinkle caps) - Rx Qu es este medicamento? El OMEPRAZOL previene la produccin de cido en el estmago. Este medicamento se [place] para tratar la enfermedad del reflujo gastroesofgico (ERGE), lceras, ciertas bacterias en el estmago, inflamacin del esfago y el sndrome de [place]. Tambin es til en el caso de enfermedades que producen un exceso de cido en el [place]. Este medicamento puede ser utilizado para otros usos; si tiene alguna pregunta consulte con su proveedor de atencin mdica o con su farmacutico. Qu le debo informar a mi profesional de la salud antes de tomar este medicamento? Necesita saber si usted presenta alguno de los siguientes problemas o situaciones: -enfermedad heptica -niveles bajos de magnesio en la sangre -una reaccin alrgica o inusual al omeprazol, a otros medicamentos, alimentos, colorantes o conservantes -si est embarazada o buscando quedar embarazada -si est amamantando a un beb Cmo debo utilizar este medicamento? Tome este medicamento por va oral con un vaso de agua. Siga las instrucciones de la etiqueta del [place]. No triture, rompa ni mastique las cpsulas. Puede abrirlas y espolvorear su contenido en pur de Archivist o yogur, se puede tomor con jugos de fruta o tambin puede tomarlas rpidamente con agua. Este medicamento  acta mejor si se lo toma con el estmago vaco entre 30 y 53 minutos antes del desayuno. Tome sus dosis a intervalos regulares. No tome su medicamento con una frecuencia mayor a la indicada. Hable con su pediatra para informarse acerca del uso de este medicamento en nios. Puede requerir atencin especial. Sobredosis: Pngase en contacto inmediatamente con un centro toxicolgico o una sala de urgencia si usted cree que haya tomado demasiado medicamento. ATENCIN: [employer] es solo para usted. No comparta este medicamento con nadie. Qu sucede si me olvido de una dosis? Si olvida una dosis, tmela lo antes posible. Si es casi la hora de la prxima dosis, tome slo esa dosis. No tome dosis adicionales o dobles. Qu puede interactuar con este medicamento? No tome esta medicina con ninguno de los siguientes medicamentos: -atazanavir -clopidogrel -nelfinavir Esta medicina tambin puede interactuar con los siguientes medicamentos: -ampicilina -ciertos medicamentos para la ansiedad o para conciliar el sueo -ciertos medicamentos que tratan o previenen cogulos sanguneos como warfarina -ciclosporina -diazepam -digoxina -disulfiram -diurticos -sales de hierro -metotrexato -mofetil micofenolato -fenitona -medicamentos recetados para infecciones micticas o por levadura tales como itraconazol, quetoconazol, voriconazol -saquinavir -tacrolimo Puede ser que esta lista no menciona todas las posibles interacciones. Informe a su profesional de KB Home	[place] de [employer] productos a base de hierbas, medicamentos de [place] o suplementos nutritivos que est tomando. Si usted fuma, consume bebidas alcohlicas o si utiliza drogas ilegales, indqueselo tambin a su profesional de KB Home	[place]. Algunas sustancias pueden interactuar con su medicamento. A qu debo estar atento al usar Coca-Cola? Pueden transcurrir [employer] antes de que mejore su dolor de [place]. Consulte a su mdico o a su  profesional de la salud si su problema no comienza a mejorar o si empeora. Puede que necesite someterse  a anlisis de [employer] est tomando Coca-Cola. Qu efectos secundarios puedo tener al [employer] este medicamento? Efectos secundarios que debe informar a su mdico o a Barrister's clerk de la salud tan pronto como sea posible: -Chief of Staff como erupcin cutnea, picazn o urticarias, hinchazn de la cara, labios o lengua -dolor de hueso, msculo o articulaciones -problemas respiratorios -dolor u opresin en el pecho -orina de color amarillo oscuro o marrn -mareos -pulso cardiaco rpido, irregular -sensacin de Chief of Staff o aturdimiento -fiebre o dolor de garganta -palpitaciones -enrojecimiento, formacin de ampollas, descamacin o distensin de la piel, inclusive dentro de la boca -convulsiones -temblores -sangrado o magulladuras inusuales -cansancio o debilidad inusual -color amarillento de los ojos o la piel Efectos secundarios que, por lo general, no requieren atencin mdica (debe informarlos a su mdico o a su profesional de la salud si persisten o si son molestos): -estreimiento -diarrea -boca seca -dolor de cabeza -nauseas Puede ser que esta lista no menciona todos los posibles efectos secundarios. Comunquese a su mdico por asesoramiento mdico [employer]. Usted puede informar los efectos secundarios a la FDA por telfono al 1-800-FDA-1088. Dnde debo guardar mi medicina? Mantngala fuera del alcance de los nios. Gurdela a temperatura ambiente de entre 15 y 15 grados C (11 y 74 grados F). Protjala de la luz y la humedad. Deseche todo el medicamento que no haya utilizado, despus de la fecha de vencimiento. ATENCIN: Este folleto es un resumen. Puede ser que no cubra toda la posible informacin. Si usted tiene preguntas acerca de esta medicina, consulte con su mdico, su farmacutico o su profesional de Technical sales engineer.    2016,  Elsevier/Gold Standard. (2014-10-17 00:00:00) Opciones de alimentos para pacientes con reflujo gastroesofgico - Adultos (Food Choices for Gastroesophageal Reflux Disease, Adult) Cuando se tiene reflujo gastroesofgico (ERGE), los alimentos que se ingieren y los hbitos de alimentacin son muy importantes. Elegir los alimentos adecuados puede ayudar a Public house manager las molestias ocasionadas por el [place]. [place]?  Elija las frutas, los vegetales, los cereales integrales, los productos lcteos, la carne de [place], de pescado y de ave con bajo contenido de grasas.  Limite las grasas, como los [place], los aderezos para [place], la [place], los frutos secos y Publishing copy.  Lleve un registro de las comidas para identificar los alimentos que ocasionan sntomas.  Evite los alimentos que le ocasionen reflujo. Pueden ser distintos para cada persona.  Haga comidas pequeas con frecuencia en lugar de tres comidas Kellogg.  Coma lentamente, en un clima distendido.  Limite el consumo de alimentos fritos.  Cocine los alimentos utilizando mtodos que no sean la fritura.  Evite el consumo alcohol.  Evite beber grandes cantidades de lquidos con las comidas.  Evite agacharse o recostarse hasta despus de 2 o 3horas de haber comido. QU ALIMENTOS NO SE RECOMIENDAN? Los siguientes son algunos alimentos y bebidas que pueden empeorar los sntomas: Astronomer. Jugo de tomate. Salsa de tomate y espagueti. Ajes. [place] y [place]. Rbano picante. Frutas Naranjas, pomelos y limn (fruta y [place]). Carnes Carnes de [place], de pescado y de ave con gran contenido de grasas. Esto incluye los perros calientes, las [place], el Startex, la salchicha, el salame y el tocino. Lcteos Leche entera y [place]. Rite Aid. Crema. [place]. Helados. Queso crema.  Bebidas Caf y t negro, con o sin cafena Bebidas gaseosas o energizantes. Condimentos Salsa picante.  Salsa barbacoa.  Dulces/postres Chocolate y cacao. [place]. Menta y mentol. Grasas y  aceites Alimentos con alto contenido de grasas, incluidas las papas fritas. Otros [place]. Especias picantes, como la Solectron Corporation, la pimienta blanca, la pimienta roja, la pimienta de cayena, el curry en [place], los clavos de [place], el jengibre y el [place] en polvo. Los artculos mencionados arriba pueden no ser [employer] Foods Company de las bebidas y los alimentos que se Higher education careers adviser. Comunquese con el nutricionista para recibir ms informacin.   Esta informacin no tiene Marine scientist el consejo del mdico. Asegrese de hacerle al mdico cualquier pregunta que tenga.   Document Released: 06/04/2005 Document Revised: 09/15/2014 Elsevier Interactive Patient Education [employer].

## 2016-07-22 LAB — URINALYSIS W MICROSCOPIC + REFLEX CULTURE
Bilirubin Urine: NEGATIVE
CASTS: NONE SEEN [LPF]
CRYSTALS: NONE SEEN [HPF]
Glucose, UA: NEGATIVE
KETONES UR: NEGATIVE
NITRITE: POSITIVE — AB
PH: 5.5 (ref 5.0–8.0)
Protein, ur: NEGATIVE
SQUAMOUS EPITHELIAL / LPF: NONE SEEN [HPF] (ref <=5)
Specific Gravity, Urine: 1.021 (ref 1.001–1.035)
YEAST: NONE SEEN [HPF]

## 2016-07-27 LAB — URINE CULTURE

## 2016-07-28 ENCOUNTER — Other Ambulatory Visit: Payer: Self-pay | Admitting: Anesthesiology

## 2016-07-28 MED ORDER — NITROFURANTOIN MONOHYD MACRO 100 MG PO CAPS: 100.0000 mg | ORAL_CAPSULE | Freq: Two times a day (BID) | ORAL | 0 refills | Status: AC

## 2016-09-10 ENCOUNTER — Ambulatory Visit (INDEPENDENT_AMBULATORY_CARE_PROVIDER_SITE_OTHER): Payer: BLUE CROSS/BLUE SHIELD | Admitting: Endocrinology

## 2016-09-10 ENCOUNTER — Encounter: Payer: Self-pay | Admitting: Endocrinology

## 2016-09-10 VITALS — BP 116/70 | HR 70 | Ht 73.0 in | Wt 292.0 lb

## 2016-09-10 DIAGNOSIS — E161 Other hypoglycemia: Secondary | ICD-10-CM

## 2016-09-10 LAB — POCT GLYCOSYLATED HEMOGLOBIN (HGB A1C): HEMOGLOBIN A1C: 5.8

## 2016-09-10 MED ORDER — PIOGLITAZONE HCL 15 MG PO TABS: 15.0000 mg | ORAL_TABLET | Freq: Every day | ORAL | 11 refills | Status: DC

## 2016-09-10 NOTE — Patient Instructions (Addendum)
I have sent a prescription to your pharmacy, to change acarbose to "pioglitizone."   Please come back for a follow-up appointment in 6 months.     Bariatric Surgery You have so much to gain by losing weight.  You may have already tried every diet and exercise plan imaginable.  And, you may have sought advice from your family physician, too.   Sometimes, in spite of such diligent efforts, you may not be able to achieve long-term results by yourself.  In cases of severe obesity, bariatric or weight loss surgery is a proven method of achieving long-term weight control.  Our Services Our bariatric surgery programs offer our patients new hope and long-term weight-loss solution.  Since introducing our services in 2003, we have conducted more than 2,400 successful procedures.  Our program is designated as a Programmer, multimedia by the Metabolic and Bariatric Surgery Accreditation and Quality Improvement Program (MBSAQIP), a IT trainer that sets rigorous patient safety and outcome standards.  Our program is also designated as a Ecologist by [employer].   Our exceptional weight-loss surgery team specializes in diagnosis, treatment, follow-up care, and ongoing support for our patients with severe weight loss challenges.  We currently offer laparoscopic sleeve gastrectomy, gastric bypass, and adjustable gastric band (LAP-BAND).    Attend our [place] Choosing to undergo a bariatric procedure is a big decision, and one that should not be taken lightly.  You now have two options in how you learn about weight-loss surgery - in person or online.  Our objective is to ensure you have all of the information that you need to evaluate the advantages and obligations of this life changing procedure.  Please note that you are not alone in this process, and our experienced team is ready to assist and answer all of your questions.  There are several ways to register for a  seminar (either on-line or in person): 1)  Call [phone] 2) Go on-line to [facility] and register for either type of seminar.  [email or web address]

## 2016-09-10 NOTE — Progress Notes (Signed)
Subjective:    Patient ID: Monica Buchanan, female    DOB: [birth year], 41 y.o.   MRN: [id number]  HPI Pt returns for f/u of reactive hypoglycemia; (she is G2P2 (C-section in [place]); during the pregnancy, she had intermittent reactive hypoglycemia; she does not want any more pregnancies (she has IUD); she has gastric bypass surgery in 2008, in [place]).  Since then, she has lost 90 lbs).  When she has sxs, cbg's vary from 54-70.  She has intermittent mild bloating of the abdomen, but no assoc diarrhea.  She stopped the acarbose, but sxs have persisted.   Past Medical History:  Diagnosis Date  . Anemia   . Decreased fetal movement 05/08/2015  . Diabetes mellitus without complication ([place])    gestational per OR consent order  . H/O infertility   . Medical history non-contributory     Past Surgical History:  Procedure Laterality Date  . ABDOMINAL SURGERY  MAY 2008   GASTRIC BYPASS  . CESAREAN SECTION     x1  . CESAREAN SECTION N/A 05/18/2015   Procedure: Repeat CESAREAN SECTION;  Surgeon: Servando Salina, MD;  Location: [facility];  Service: Obstetrics;  Laterality: N/A;  EDD: 05/21/15  . KNEE SURGERY Right     Social History   Social History  . Marital status: Divorced    Spouse name: N/A  . Number of children: N/A  . Years of education: N/A   Occupational History  . Not on file.   Social History Main Topics  . Smoking status: Former Smoker    Types: Cigarettes    Quit date: 03/14/2014  . Smokeless tobacco: Never Used  . Alcohol use No  . Drug use: No  . Sexual activity: Yes   Other Topics Concern  . Not on file   Social History Narrative  . No narrative on file    Current Outpatient Prescriptions on File Prior to Visit  Medication Sig Dispense Refill  . BAYER MICROLET LANCETS lancets Use to check blood sugar 1 time per day. 100 each 1  . ferrous sulfate 325 (65 FE) MG tablet Take 325 mg by mouth daily with breakfast. Reported on 0000000    . folic acid (FOLVITE)  1 MG tablet Take 1 mg by mouth daily. Reported on 12/13/2015    . glucose blood (BAYER CONTOUR NEXT TEST) test strip Use to check blood sugar 1 time per day. 100 each 3  . omeprazole (PRILOSEC OTC) 20 MG tablet Take 1 tablet (20 mg total) by mouth daily. 30 tablet 3  . Prenatal Multivit-Min-Fe-FA (PRENATAL VITAMINS PO) Take 1 tablet by mouth daily. Reported on 12/13/2015    . SUMAtriptan (IMITREX) 50 MG tablet Take one tablet at onset of headache may repeat a second dose in 2 hours. Not to exceed 2 tablets in 24 hours 10 tablet 1  . triamcinolone cream (KENALOG) 0.1 % Apply 1 application topically 2 (two) times daily. 30 g 0   No current facility-administered medications on file prior to visit.     No Known Allergies  Family History  Problem Relation Age of Onset  . Diabetes Mother   . Hypertension Father   . Diabetes Maternal Grandmother   . Diabetes Paternal Grandmother   . Polycystic ovary syndrome Neg Hx     BP 116/70   Pulse 70   Ht 6\' 1"  (1.854 m)   Wt 292 lb (132.5 kg)   SpO2 97%   BMI 38.52 kg/m    Review of  Systems She has gained weight.      Objective:   Physical Exam VITAL SIGNS:  See vs page.   GENERAL: no distress.  Morbid obesity.   Ext: no edema.    A1c=5.8%    Assessment & Plan:  GI sxs: these are limiting rx of reactive hypoglycemia Obesity, worse: she should have repeat surgery.   Patient is advised the following: Patient Instructions  I have sent a prescription to your pharmacy, to change acarbose to "pioglitizone."   Please come back for a follow-up appointment in 6 months.     Bariatric Surgery You have so much to gain by losing weight.  You may have already tried every diet and exercise plan imaginable.  And, you may have sought advice from your family physician, too.   Sometimes, in spite of such diligent efforts, you may not be able to achieve long-term results by yourself.  In cases of severe obesity, bariatric or weight loss surgery is a  proven method of achieving long-term weight control.  Our Services Our bariatric surgery programs offer our patients new hope and long-term weight-loss solution.  Since introducing our services in 2003, we have conducted more than 2,400 successful procedures.  Our program is designated as a Programmer, multimedia by the Metabolic and Bariatric Surgery Accreditation and Quality Improvement Program (MBSAQIP), a IT trainer that sets rigorous patient safety and outcome standards.  Our program is also designated as a Ecologist by [employer].   Our exceptional weight-loss surgery team specializes in diagnosis, treatment, follow-up care, and ongoing support for our patients with severe weight loss challenges.  We currently offer laparoscopic sleeve gastrectomy, gastric bypass, and adjustable gastric band (LAP-BAND).    Attend our [place] Choosing to undergo a bariatric procedure is a big decision, and one that should not be taken lightly.  You now have two options in how you learn about weight-loss surgery - in person or online.  Our objective is to ensure you have all of the information that you need to evaluate the advantages and obligations of this life changing procedure.  Please note that you are not alone in this process, and our experienced team is ready to assist and answer all of your questions.  There are several ways to register for a seminar (either on-line or in person): 1)  Call [phone] 2) Go on-line to [facility] and register for either type of seminar.  [email or web address]

## 2016-09-19 DIAGNOSIS — M5416 Radiculopathy, lumbar region: Secondary | ICD-10-CM | POA: Diagnosis not present

## 2016-09-19 DIAGNOSIS — M9903 Segmental and somatic dysfunction of lumbar region: Secondary | ICD-10-CM | POA: Diagnosis not present

## 2016-09-19 DIAGNOSIS — M9904 Segmental and somatic dysfunction of sacral region: Secondary | ICD-10-CM | POA: Diagnosis not present

## 2016-09-19 DIAGNOSIS — M5386 Other specified dorsopathies, lumbar region: Secondary | ICD-10-CM | POA: Diagnosis not present

## 2016-09-20 DIAGNOSIS — M5386 Other specified dorsopathies, lumbar region: Secondary | ICD-10-CM | POA: Diagnosis not present

## 2016-09-20 DIAGNOSIS — M9904 Segmental and somatic dysfunction of sacral region: Secondary | ICD-10-CM | POA: Diagnosis not present

## 2016-09-20 DIAGNOSIS — M5416 Radiculopathy, lumbar region: Secondary | ICD-10-CM | POA: Diagnosis not present

## 2016-09-20 DIAGNOSIS — M9903 Segmental and somatic dysfunction of lumbar region: Secondary | ICD-10-CM | POA: Diagnosis not present

## 2016-09-23 ENCOUNTER — Encounter (HOSPITAL_COMMUNITY): Payer: Self-pay | Admitting: Emergency Medicine

## 2016-09-23 ENCOUNTER — Emergency Department (HOSPITAL_COMMUNITY)
Admission: EM | Admit: 2016-09-23 | Discharge: 2016-09-23 | Disposition: A | Discharge status: Home / Self Care | Payer: BLUE CROSS/BLUE SHIELD | Attending: Emergency Medicine | Admitting: Emergency Medicine

## 2016-09-23 DIAGNOSIS — Z87891 Personal history of nicotine dependence: Secondary | ICD-10-CM | POA: Insufficient documentation

## 2016-09-23 DIAGNOSIS — E119 Type 2 diabetes mellitus without complications: Secondary | ICD-10-CM | POA: Diagnosis not present

## 2016-09-23 DIAGNOSIS — M5432 Sciatica, left side: Secondary | ICD-10-CM | POA: Insufficient documentation

## 2016-09-23 DIAGNOSIS — Z79899 Other long term (current) drug therapy: Secondary | ICD-10-CM | POA: Diagnosis not present

## 2016-09-23 DIAGNOSIS — M5442 Lumbago with sciatica, left side: Secondary | ICD-10-CM | POA: Diagnosis not present

## 2016-09-23 DIAGNOSIS — M545 Low back pain: Secondary | ICD-10-CM | POA: Diagnosis not present

## 2016-09-23 DIAGNOSIS — R42 Dizziness and giddiness: Secondary | ICD-10-CM | POA: Diagnosis not present

## 2016-09-23 DIAGNOSIS — R404 Transient alteration of awareness: Secondary | ICD-10-CM | POA: Diagnosis not present

## 2016-09-23 HISTORY — DX: Sciatica, unspecified side: M54.30

## 2016-09-23 LAB — COMPREHENSIVE METABOLIC PANEL
ALBUMIN: 4.2 g/dL (ref 3.5–5.0)
ALK PHOS: 51 U/L (ref 38–126)
ALT: 13 U/L — AB (ref 14–54)
AST: 17 U/L (ref 15–41)
Anion gap: 7 (ref 5–15)
BUN: 12 mg/dL (ref 6–20)
CALCIUM: 9.1 mg/dL (ref 8.9–10.3)
CO2: 26 mmol/L (ref 22–32)
CREATININE: 0.73 mg/dL (ref 0.44–1.00)
Chloride: 103 mmol/L (ref 101–111)
GFR calc Af Amer: >60 mL/min (ref >60)
GFR calc non Af Amer: >60 mL/min (ref >60)
GLUCOSE: 108 mg/dL — AB (ref 65–99)
Potassium: 4 mmol/L (ref 3.5–5.1)
Sodium: 136 mmol/L (ref 135–145)
Total Bilirubin: 0.4 mg/dL (ref 0.3–1.2)
Total Protein: 7.9 g/dL (ref 6.5–8.1)

## 2016-09-23 LAB — URINALYSIS, ROUTINE W REFLEX MICROSCOPIC
Bilirubin Urine: NEGATIVE
Glucose, UA: NEGATIVE mg/dL
HGB URINE DIPSTICK: NEGATIVE
Ketones, ur: NEGATIVE mg/dL
Leukocytes, UA: NEGATIVE
NITRITE: NEGATIVE
Protein, ur: 30 mg/dL — AB
SPECIFIC GRAVITY, URINE: 1.021 (ref 1.005–1.030)
pH: 6 (ref 5.0–8.0)

## 2016-09-23 LAB — CBC WITH DIFFERENTIAL/PLATELET
Basophils Absolute: 0 10*3/uL (ref 0.0–0.1)
Basophils Relative: 0 %
EOS ABS: 0.1 10*3/uL (ref 0.0–0.7)
EOS PCT: 1 %
HEMATOCRIT: 38.8 % (ref 36.0–46.0)
Hemoglobin: 12.4 g/dL (ref 12.0–15.0)
Lymphocytes Relative: 12 %
Lymphs Abs: 1.5 10*3/uL (ref 0.7–4.0)
MCH: 26.7 pg (ref 26.0–34.0)
MCHC: 32 g/dL (ref 30.0–36.0)
MCV: 83.4 fL (ref 78.0–100.0)
MONO ABS: 1 10*3/uL (ref 0.1–1.0)
MONOS PCT: 8 %
NEUTROS ABS: 10.3 10*3/uL — AB (ref 1.7–7.7)
Neutrophils Relative %: 79 %
Platelets: 376 10*3/uL (ref 150–400)
RBC: 4.65 MIL/uL (ref 3.87–5.11)
RDW: 13.9 % (ref 11.5–15.5)
WBC: 12.9 10*3/uL — ABNORMAL HIGH (ref 4.0–10.5)

## 2016-09-23 LAB — POC URINE PREG, ED: PREG TEST UR: NEGATIVE

## 2016-09-23 MED ORDER — MORPHINE SULFATE (PF) 4 MG/ML IV SOLN
4.0000 mg | Freq: Once | INTRAVENOUS | Status: AC
  Administered 2016-09-23: 4 mg via INTRAVENOUS
  Filled 2016-09-23: qty 1

## 2016-09-23 MED ORDER — METHOCARBAMOL 1000 MG/10ML IJ SOLN: 1000.0000 mg | Freq: Once | INTRAMUSCULAR | Status: DC

## 2016-09-23 MED ORDER — METHOCARBAMOL 1000 MG/10ML IJ SOLN
1000.0000 mg | Freq: Once | INTRAVENOUS | Status: AC
  Administered 2016-09-23: 1000 mg via INTRAVENOUS
  Filled 2016-09-23: qty 10

## 2016-09-23 MED ORDER — SODIUM CHLORIDE 0.9 % IV BOLUS (SEPSIS)
1000.0000 mL | Freq: Once | INTRAVENOUS | Status: AC
  Administered 2016-09-23: 1000 mL via INTRAVENOUS

## 2016-09-23 MED ORDER — PREDNISONE 10 MG PO TABS: ORAL_TABLET | ORAL | 0 refills | Status: DC

## 2016-09-23 MED ORDER — METHOCARBAMOL 500 MG PO TABS: 500.0000 mg | ORAL_TABLET | Freq: Two times a day (BID) | ORAL | 0 refills | Status: DC

## 2016-09-23 MED ORDER — IBUPROFEN 800 MG PO TABS: 800.0000 mg | ORAL_TABLET | Freq: Three times a day (TID) | ORAL | 0 refills | Status: DC

## 2016-09-23 MED ORDER — ONDANSETRON HCL 4 MG/2ML IJ SOLN
4.0000 mg | Freq: Once | INTRAMUSCULAR | Status: AC
  Administered 2016-09-23: 4 mg via INTRAVENOUS
  Filled 2016-09-23: qty 2

## 2016-09-23 MED ORDER — KETOROLAC TROMETHAMINE 30 MG/ML IJ SOLN
30.0000 mg | Freq: Once | INTRAMUSCULAR | Status: AC
  Administered 2016-09-23: 30 mg via INTRAVENOUS
  Filled 2016-09-23: qty 1

## 2016-09-23 MED ORDER — LIDOCAINE 5 % EX PTCH: 1.0000 | MEDICATED_PATCH | CUTANEOUS | 0 refills | Status: DC

## 2016-09-23 NOTE — ED Provider Notes (Signed)
[facility] DEPT Provider Note   CSN: [id number] Arrival date & time: 09/23/16  0920     History   Chief Complaint Chief Complaint  Patient presents with  . Back Pain    Lower    HPI Monica Buchanan is a 41 y.o. female.  HPI   Monica Buchanan is a 41 y.o. female, with a history of Anemia and sciatica, presenting to the ED with bilateral lower back beginning four days ago. Pain began on the right and then two days ago moved to the left. Pain is a tightness, intermittent, worse with movement, radiates down the back of the left leg. Pt was at work when the pain began. She is an Electrical engineer. She denies known injury. She states she has had similar pain in the past, but it has been at least a year from the last instance. Endorses nausea.  Denies fever/chills, neuro deficits, vomiting/diarrhea, urinary complaints, abnormal vaginal discharge or bleeding, changes in bowel or bladder function, or any other complaints.  Pt had an appointment with [facility] this morning at 9:30 am, but states she was in too much pain.   Intermittent interpretation provided by patient's son at the bedside. LMP 09/08/16. Only abdominal surgery is a bariatric gastric bypass in [id number] without complication.  Past Medical History:  Diagnosis Date  . Anemia   . Decreased fetal movement 05/08/2015  . Diabetes mellitus without complication ([place])    gestational per OR consent order  . H/O infertility   . Medical history non-contributory   . Sciatica     Patient Active Problem List   Diagnosis Date Noted  . Episodic cluster headache, not intractable 07/21/2016  . Gastroesophageal reflux disease with esophagitis 07/21/2016  . Reactive hypoglycemia 12/10/2015  . Hypoglycemia after GI (gastrointestinal) surgery 08/08/2015  . Intramural leiomyoma of uterus 07/25/2015  . IUD (intrauterine device) in place 07/25/2015  . History of gestational diabetes mellitus, not currently pregnant 07/18/2015    . History of PCOS 07/18/2015  . Postpartum care following cesarean delivery (9/9) 05/18/2015  . Decreased fetal movement 05/08/2015  . Pregnant 10/05/2014  . Iron deficiency anemia 05/16/2014  . Overweight 09/14/2013  . Smoker 09/14/2013  . Gastric bypass status for obesity 09/14/2013    Past Surgical History:  Procedure Laterality Date  . ABDOMINAL SURGERY  MAY 2008   GASTRIC BYPASS  . CESAREAN SECTION     x1  . CESAREAN SECTION N/A 05/18/2015   Procedure: Repeat CESAREAN SECTION;  Surgeon: Servando Salina, MD;  Location: [facility];  Service: Obstetrics;  Laterality: N/A;  EDD: 05/21/15  . KNEE SURGERY Right     OB History    Gravida Para Term Preterm AB Living   2 2 2  0 0 2   SAB TAB Ectopic Multiple Live Births   0 0 0 0 1       Home Medications    Prior to Admission medications   Medication Sig Start Date End Date Taking? Authorizing Provider  BAYER MICROLET LANCETS lancets Use to check blood sugar 1 time per day. 12/10/15  Yes Renato Shin, MD  glucose blood (BAYER CONTOUR NEXT TEST) test strip Use to check blood sugar 1 time per day. 12/10/15  Yes Renato Shin, MD  omeprazole (PRILOSEC OTC) 20 MG tablet Take 1 tablet (20 mg total) by mouth daily. 07/21/16  Yes Terrance Mass, MD  ibuprofen (ADVIL,MOTRIN) 800 MG tablet Take 1 tablet (800 mg total) by mouth 3 (three) times daily. 09/23/16  Pernell Lenoir C Allyn Bertoni, PA-C  lidocaine (LIDODERM) 5 % Place 1 patch onto the skin daily. Remove & Discard patch within 12 hours or as directed by MD 09/23/16   Lorayne Bender, PA-C  methocarbamol (ROBAXIN) 500 MG tablet Take 1 tablet (500 mg total) by mouth 2 (two) times daily. 09/23/16   Donnesha Karg C Albena Comes, PA-C  pioglitazone (ACTOS) 15 MG tablet Take 1 tablet (15 mg total) by mouth daily. Patient not taking: Reported on 09/23/2016 09/10/16   Renato Shin, MD  predniSONE (DELTASONE) 10 MG tablet Take 6 tabs day 1, 5 tabs day 2, 4 tabs day 3, 3 tabs day 4, 2 tabs day 5, and 1 tab on day 6. 09/23/16   Nevia Henkin C Elanda Garmany,  PA-C  SUMAtriptan (IMITREX) 50 MG tablet Take one tablet at onset of headache may repeat a second dose in 2 hours. Not to exceed 2 tablets in 24 hours Patient not taking: Reported on 09/23/2016 07/21/16   Terrance Mass, MD  triamcinolone cream (KENALOG) 0.1 % Apply 1 application topically 2 (two) times daily. Patient not taking: Reported on 09/23/2016 12/07/15   Melony Overly, MD    Family History Family History  Problem Relation Age of Onset  . Diabetes Mother   . Hypertension Father   . Diabetes Maternal Grandmother   . Diabetes Paternal Grandmother   . Polycystic ovary syndrome Neg Hx     Social History Social History  Substance Use Topics  . Smoking status: Former Smoker    Types: Cigarettes    Quit date: 03/14/2014  . Smokeless tobacco: Never Used  . Alcohol use No     Allergies   Patient has no known allergies.   Review of Systems Review of Systems  Constitutional: Negative for chills and fever.  Gastrointestinal: Negative for abdominal pain, nausea and vomiting.  Genitourinary: Negative for dysuria, flank pain and frequency.  Musculoskeletal: Positive for back pain.  Neurological: Negative for weakness and numbness.  All other systems reviewed and are negative.    Physical Exam Updated Vital Signs BP 132/77 (BP Location: Right Arm)   Pulse 79   Temp 98 F (36.7 C) (Oral)   Resp 18   Ht 6\' 1"  (1.854 m)   Wt 132.5 kg   LMP 09/08/2016   SpO2 100%   BMI 38.52 kg/m   Physical Exam  Constitutional: She is oriented to person, place, and time. She appears well-developed and well-nourished. No distress.  HENT:  Head: Normocephalic and atraumatic.  Eyes: Conjunctivae are normal.  Neck: Normal range of motion. Neck supple.  Cardiovascular: Normal rate, regular rhythm, normal heart sounds and intact distal pulses.   Pulmonary/Chest: Effort normal and breath sounds normal. No respiratory distress.  Abdominal: Soft. There is tenderness in the right lower  quadrant, suprapubic area and left lower quadrant. There is no guarding.  Musculoskeletal: She exhibits no edema.  Tenderness to the bilateral lower thoracic and upper lumbar musculature, worse on the left. Tenderness continues around the bilateral flanks and into the lower abdomen bilaterally in the lower thoracic and upper lumbar spinal nerve distributions.  Flexion and extension at the hip produces worsened pain in the lower back, more on the left.   Lymphadenopathy:    She has no cervical adenopathy.  Neurological: She is alert and oriented to person, place, and time.  No sensory deficits. Strength 5/5 in all extremities. Antalgic gait, but able to ambulate without assistance. Coordination intact including heel to shin and finger to nose.  Skin: Skin is warm and dry. She is not diaphoretic.  Psychiatric: She has a normal mood and affect. Her behavior is normal.  Nursing note and vitals reviewed.    ED Treatments / Results  Labs (all labs ordered are listed, but only abnormal results are displayed) Labs Reviewed  COMPREHENSIVE METABOLIC PANEL - Abnormal; Notable for the following:       Result Value   Glucose, Bld 108 (*)    ALT 13 (*)    All other components within normal limits  CBC WITH DIFFERENTIAL/PLATELET - Abnormal; Notable for the following:    WBC 12.9 (*)    Neutro Abs 10.3 (*)    All other components within normal limits  URINALYSIS, ROUTINE W REFLEX MICROSCOPIC - Abnormal; Notable for the following:    APPearance CLOUDY (*)    Protein, ur 30 (*)    Bacteria, UA RARE (*)    Squamous Epithelial / LPF TOO NUMEROUS TO COUNT (*)    All other components within normal limits  POC URINE PREG, ED    EKG  EKG Interpretation None       Radiology No results found.  Procedures Procedures (including critical care time)  Medications Ordered in ED Medications  ketorolac (TORADOL) 30 MG/ML injection 30 mg (30 mg Intravenous Given 09/23/16 1054)  morphine 4 MG/ML  injection 4 mg (4 mg Intravenous Given 09/23/16 1055)  ondansetron (ZOFRAN) injection 4 mg (4 mg Intravenous Given 09/23/16 1054)  methocarbamol (ROBAXIN) 1,000 mg in dextrose 5 % 50 mL IVPB (0 mg Intravenous Stopped 09/23/16 1130)  sodium chloride 0.9 % bolus 1,000 mL (1,000 mLs Intravenous New Bag/Given 09/23/16 1328)     Initial Impression / Assessment and Plan / ED Course  I have reviewed the triage vital signs and the nursing notes.  Pertinent labs & imaging results that were available during my care of the patient were reviewed by me and considered in my medical decision making (see chart for details).  Clinical Course     Patient presents with lower back pain with symptoms of sciatica. She also has tenderness and sensitivity in a spinal nerve distribution. She is nontoxic-appearing and vitals are within normal limits. Upon reevaluation, patient voices significant relief with conservative measures. She no longer has any abdominal tenderness, supporting the theory that this was caused by nerve pain. She was able to sit up and stand without assistance. She still voices increase in pain with standing and walking. Patient did endorse some lightheadedness when she was ambulated a second time. I suspect that this may be due to the fact that the patient has not had anything to eat or drink since last evening, and had poor oral intake prior to that.  Following a liter of IV fluids and some food, patient states that her lightheadedness has resolved. She now has no acute complaints. Appears stable for discharge. PCP follow-up. Home care and return precautions discussed. Patient voiced understanding of all instructions and is comfortable with discharge.    Vitals:   09/23/16 0930 09/23/16 0945 09/23/16 1030 09/23/16 1100  BP:   118/81 118/77  Pulse:  76 76 78  Resp:   18 18  Temp:      TempSrc:      SpO2:  99% 100% 99%  Weight: 132.5 kg     Height: 6\' 1"  (1.854 m)      Vitals:   09/23/16  1100 09/23/16 1200 09/23/16 1230 09/23/16 1430  BP: 118/77 111/67 110/73 112/69  Pulse:  78 85 78 78  Resp: 18 18 18 18   Temp:      TempSrc:      SpO2: 99% 96% 96% 100%  Weight:      Height:          Final Clinical Impressions(s) / ED Diagnoses   Final diagnoses:  Sciatica of left side    New Prescriptions New Prescriptions   IBUPROFEN (ADVIL,MOTRIN) 800 MG TABLET    Take 1 tablet (800 mg total) by mouth 3 (three) times daily.   LIDOCAINE (LIDODERM) 5 %    Place 1 patch onto the skin daily. Remove & Discard patch within 12 hours or as directed by MD   METHOCARBAMOL (ROBAXIN) 500 MG TABLET    Take 1 tablet (500 mg total) by mouth 2 (two) times daily.   PREDNISONE (DELTASONE) 10 MG TABLET    Take 6 tabs day 1, 5 tabs day 2, 4 tabs day 3, 3 tabs day 4, 2 tabs day 5, and 1 tab on day 6.     Lorayne Bender, PA-C 09/23/16 1511    Dorie Rank, MD 09/24/16 [phone]

## 2016-09-23 NOTE — ED Triage Notes (Addendum)
Per EMS, patient is complaining of lower back pain radiating to left leg x4 days. Patient has taken tylenol with minimal relief. Denies injury/trauma to this area or numbness/tingling or urinary symptoms. Patient states she is unable to bear weight on left lower extremity and states she cannot walk. Patient had an orthopedic appointment scheduled for today, however, states she could not make it due to the pain. Hx of sciatia.

## 2016-09-23 NOTE — ED Notes (Signed)
ED Provider at bedside. 

## 2016-09-23 NOTE — ED Notes (Signed)
Bed: WA17 Expected date:  Expected time:  Means of arrival:  Comments: Back pain

## 2016-09-23 NOTE — ED Notes (Signed)
Pt ambulated to the door without walker but then stated she hears ringing in her ears and said she was dizzy

## 2016-09-23 NOTE — Discharge Instructions (Signed)
There were no significant abnormalities on your lab work. Take it easy, but do not lay around too much as this may make any stiffness worse.   Antiinflammatory medications: Take 500 mg of naproxen every 12 hours or 800 mg of ibuprofen every 8 hours for the next 3 days. Take these medications with food to avoid upset stomach. Choose only one of these medications, do not take them together.  Muscle relaxer: Robaxin is a muscle relaxer and may help loosen stiff muscles. Do not take the Robaxin while driving or performing other dangerous activities.   Lidocaine patches: These are available via either prescription or over-the-counter. The over-the-counter option may be more economical one and are likely just as effective. There are multiple over-the-counter brands, such as Salonpas.  Exercises: Be sure to perform the attached exercises starting with three times a week and working up to performing them daily. This is an essential part of preventing long term problems. Follow up with a primary care provider for any future management of these complaints.  You also exhibit symptoms of dehydration. Be sure to stay well-hydrated with regular water intake. A helpful way to determine if you are hydrated is looking at the urine. The urine should be a very light yellow and almost clear. If it is darker than that, you are not drinking enough water.

## 2016-09-29 DIAGNOSIS — M545 Low back pain: Secondary | ICD-10-CM | POA: Diagnosis not present

## 2016-09-29 DIAGNOSIS — M62830 Muscle spasm of back: Secondary | ICD-10-CM | POA: Diagnosis not present

## 2016-09-30 DIAGNOSIS — M9904 Segmental and somatic dysfunction of sacral region: Secondary | ICD-10-CM | POA: Diagnosis not present

## 2016-09-30 DIAGNOSIS — M5416 Radiculopathy, lumbar region: Secondary | ICD-10-CM | POA: Diagnosis not present

## 2016-09-30 DIAGNOSIS — M9903 Segmental and somatic dysfunction of lumbar region: Secondary | ICD-10-CM | POA: Diagnosis not present

## 2016-09-30 DIAGNOSIS — M5386 Other specified dorsopathies, lumbar region: Secondary | ICD-10-CM | POA: Diagnosis not present

## 2016-10-01 DIAGNOSIS — M5386 Other specified dorsopathies, lumbar region: Secondary | ICD-10-CM | POA: Diagnosis not present

## 2016-10-01 DIAGNOSIS — M5416 Radiculopathy, lumbar region: Secondary | ICD-10-CM | POA: Diagnosis not present

## 2016-10-01 DIAGNOSIS — M9904 Segmental and somatic dysfunction of sacral region: Secondary | ICD-10-CM | POA: Diagnosis not present

## 2016-10-01 DIAGNOSIS — M9903 Segmental and somatic dysfunction of lumbar region: Secondary | ICD-10-CM | POA: Diagnosis not present

## 2016-10-02 DIAGNOSIS — M9903 Segmental and somatic dysfunction of lumbar region: Secondary | ICD-10-CM | POA: Diagnosis not present

## 2016-10-02 DIAGNOSIS — M9904 Segmental and somatic dysfunction of sacral region: Secondary | ICD-10-CM | POA: Diagnosis not present

## 2016-10-02 DIAGNOSIS — M5386 Other specified dorsopathies, lumbar region: Secondary | ICD-10-CM | POA: Diagnosis not present

## 2016-10-02 DIAGNOSIS — M5416 Radiculopathy, lumbar region: Secondary | ICD-10-CM | POA: Diagnosis not present

## 2016-10-06 DIAGNOSIS — M9904 Segmental and somatic dysfunction of sacral region: Secondary | ICD-10-CM | POA: Diagnosis not present

## 2016-10-06 DIAGNOSIS — M9903 Segmental and somatic dysfunction of lumbar region: Secondary | ICD-10-CM | POA: Diagnosis not present

## 2016-10-06 DIAGNOSIS — M5416 Radiculopathy, lumbar region: Secondary | ICD-10-CM | POA: Diagnosis not present

## 2016-10-06 DIAGNOSIS — M5386 Other specified dorsopathies, lumbar region: Secondary | ICD-10-CM | POA: Diagnosis not present

## 2016-10-08 DIAGNOSIS — M9903 Segmental and somatic dysfunction of lumbar region: Secondary | ICD-10-CM | POA: Diagnosis not present

## 2016-10-08 DIAGNOSIS — M5386 Other specified dorsopathies, lumbar region: Secondary | ICD-10-CM | POA: Diagnosis not present

## 2016-10-08 DIAGNOSIS — M5416 Radiculopathy, lumbar region: Secondary | ICD-10-CM | POA: Diagnosis not present

## 2016-10-08 DIAGNOSIS — M9904 Segmental and somatic dysfunction of sacral region: Secondary | ICD-10-CM | POA: Diagnosis not present

## 2016-10-13 DIAGNOSIS — M5386 Other specified dorsopathies, lumbar region: Secondary | ICD-10-CM | POA: Diagnosis not present

## 2016-10-13 DIAGNOSIS — M9904 Segmental and somatic dysfunction of sacral region: Secondary | ICD-10-CM | POA: Diagnosis not present

## 2016-10-13 DIAGNOSIS — M9903 Segmental and somatic dysfunction of lumbar region: Secondary | ICD-10-CM | POA: Diagnosis not present

## 2016-10-13 DIAGNOSIS — M5416 Radiculopathy, lumbar region: Secondary | ICD-10-CM | POA: Diagnosis not present

## 2016-10-15 DIAGNOSIS — M9903 Segmental and somatic dysfunction of lumbar region: Secondary | ICD-10-CM | POA: Diagnosis not present

## 2016-10-15 DIAGNOSIS — M5386 Other specified dorsopathies, lumbar region: Secondary | ICD-10-CM | POA: Diagnosis not present

## 2016-10-15 DIAGNOSIS — M5416 Radiculopathy, lumbar region: Secondary | ICD-10-CM | POA: Diagnosis not present

## 2016-10-15 DIAGNOSIS — M9904 Segmental and somatic dysfunction of sacral region: Secondary | ICD-10-CM | POA: Diagnosis not present

## 2016-10-20 DIAGNOSIS — M9904 Segmental and somatic dysfunction of sacral region: Secondary | ICD-10-CM | POA: Diagnosis not present

## 2016-10-20 DIAGNOSIS — M5416 Radiculopathy, lumbar region: Secondary | ICD-10-CM | POA: Diagnosis not present

## 2016-10-20 DIAGNOSIS — M62830 Muscle spasm of back: Secondary | ICD-10-CM | POA: Diagnosis not present

## 2016-10-20 DIAGNOSIS — M5386 Other specified dorsopathies, lumbar region: Secondary | ICD-10-CM | POA: Diagnosis not present

## 2016-10-20 DIAGNOSIS — M9903 Segmental and somatic dysfunction of lumbar region: Secondary | ICD-10-CM | POA: Diagnosis not present

## 2016-10-30 DIAGNOSIS — M5416 Radiculopathy, lumbar region: Secondary | ICD-10-CM | POA: Diagnosis not present

## 2016-10-30 DIAGNOSIS — M5386 Other specified dorsopathies, lumbar region: Secondary | ICD-10-CM | POA: Diagnosis not present

## 2016-10-30 DIAGNOSIS — M9904 Segmental and somatic dysfunction of sacral region: Secondary | ICD-10-CM | POA: Diagnosis not present

## 2016-10-30 DIAGNOSIS — M9903 Segmental and somatic dysfunction of lumbar region: Secondary | ICD-10-CM | POA: Diagnosis not present

## 2016-12-24 DIAGNOSIS — M9904 Segmental and somatic dysfunction of sacral region: Secondary | ICD-10-CM | POA: Diagnosis not present

## 2016-12-24 DIAGNOSIS — M9903 Segmental and somatic dysfunction of lumbar region: Secondary | ICD-10-CM | POA: Diagnosis not present

## 2016-12-24 DIAGNOSIS — M5416 Radiculopathy, lumbar region: Secondary | ICD-10-CM | POA: Diagnosis not present

## 2016-12-24 DIAGNOSIS — M5386 Other specified dorsopathies, lumbar region: Secondary | ICD-10-CM | POA: Diagnosis not present

## 2016-12-25 DIAGNOSIS — M5386 Other specified dorsopathies, lumbar region: Secondary | ICD-10-CM | POA: Diagnosis not present

## 2016-12-25 DIAGNOSIS — M9903 Segmental and somatic dysfunction of lumbar region: Secondary | ICD-10-CM | POA: Diagnosis not present

## 2016-12-25 DIAGNOSIS — M9904 Segmental and somatic dysfunction of sacral region: Secondary | ICD-10-CM | POA: Diagnosis not present

## 2016-12-25 DIAGNOSIS — M5416 Radiculopathy, lumbar region: Secondary | ICD-10-CM | POA: Diagnosis not present

## 2016-12-30 DIAGNOSIS — M5416 Radiculopathy, lumbar region: Secondary | ICD-10-CM | POA: Diagnosis not present

## 2016-12-30 DIAGNOSIS — M9904 Segmental and somatic dysfunction of sacral region: Secondary | ICD-10-CM | POA: Diagnosis not present

## 2016-12-30 DIAGNOSIS — M5386 Other specified dorsopathies, lumbar region: Secondary | ICD-10-CM | POA: Diagnosis not present

## 2016-12-30 DIAGNOSIS — M9903 Segmental and somatic dysfunction of lumbar region: Secondary | ICD-10-CM | POA: Diagnosis not present

## 2017-01-13 DIAGNOSIS — M9903 Segmental and somatic dysfunction of lumbar region: Secondary | ICD-10-CM | POA: Diagnosis not present

## 2017-01-13 DIAGNOSIS — M9904 Segmental and somatic dysfunction of sacral region: Secondary | ICD-10-CM | POA: Diagnosis not present

## 2017-01-13 DIAGNOSIS — M5416 Radiculopathy, lumbar region: Secondary | ICD-10-CM | POA: Diagnosis not present

## 2017-01-13 DIAGNOSIS — M5386 Other specified dorsopathies, lumbar region: Secondary | ICD-10-CM | POA: Diagnosis not present

## 2017-01-21 ENCOUNTER — Encounter: Payer: Self-pay | Admitting: Gynecology

## 2017-03-09 ENCOUNTER — Ambulatory Visit: Payer: BLUE CROSS/BLUE SHIELD | Admitting: Endocrinology

## 2017-03-31 ENCOUNTER — Encounter: Payer: Self-pay | Admitting: Endocrinology

## 2017-03-31 ENCOUNTER — Ambulatory Visit (INDEPENDENT_AMBULATORY_CARE_PROVIDER_SITE_OTHER): Payer: BLUE CROSS/BLUE SHIELD | Admitting: Endocrinology

## 2017-03-31 VITALS — BP 138/88 | HR 93 | Wt 300.4 lb

## 2017-03-31 DIAGNOSIS — E161 Other hypoglycemia: Secondary | ICD-10-CM | POA: Diagnosis not present

## 2017-03-31 LAB — BASIC METABOLIC PANEL
BUN: 10 mg/dL (ref 6–23)
CO2: 28 mEq/L (ref 19–32)
Calcium: 9.1 mg/dL (ref 8.4–10.5)
Chloride: 104 mEq/L (ref 96–112)
Creatinine, Ser: 0.8 mg/dL (ref 0.40–1.20)
GFR: 83.77 mL/min (ref >60.00)
Glucose, Bld: 158 mg/dL — ABNORMAL HIGH (ref 70–99)
POTASSIUM: 3.8 meq/L (ref 3.5–5.1)
SODIUM: 137 meq/L (ref 135–145)

## 2017-03-31 LAB — TSH: TSH: 0.89 u[IU]/mL (ref 0.35–4.50)

## 2017-03-31 LAB — HEMOGLOBIN A1C: Hgb A1c MFr Bld: 6.4 % (ref 4.6–6.5)

## 2017-03-31 MED ORDER — PIOGLITAZONE HCL 15 MG PO TABS: 7.5000 mg | ORAL_TABLET | Freq: Every day | ORAL | 5 refills | Status: DC

## 2017-03-31 MED ORDER — GLUCOSE BLOOD VI STRP: ORAL_STRIP | 3 refills | Status: DC

## 2017-03-31 NOTE — Patient Instructions (Addendum)
I have sent a prescription to your pharmacy, for the strips.   I have also sent a prescription to your pharmacy, to resume pioglitizone at 1/2 pill per day.  Please come back for a follow-up appointment in 6 months.     Bariatric Surgery You have so much to gain by losing weight.  You may have already tried every diet and exercise plan imaginable.  And, you may have sought advice from your family physician, too.   Sometimes, in spite of such diligent efforts, you may not be able to achieve long-term results by yourself.  In cases of severe obesity, bariatric or weight loss surgery is a proven method of achieving long-term weight control.  Our Services Our bariatric surgery programs offer our patients new hope and long-term weight-loss solution.  Since introducing our services in 2003, we have conducted more than 2,400 successful procedures.  Our program is designated as a Programmer, multimedia by the Metabolic and Bariatric Surgery Accreditation and Quality Improvement Program (MBSAQIP), a IT trainer that sets rigorous patient safety and outcome standards.  Our program is also designated as a Ecologist by [employer].   Our exceptional weight-loss surgery team specializes in diagnosis, treatment, follow-up care, and ongoing support for our patients with severe weight loss challenges.  We currently offer laparoscopic sleeve gastrectomy, gastric bypass, and adjustable gastric band (LAP-BAND).    Attend our [place] Choosing to undergo a bariatric procedure is a big decision, and one that should not be taken lightly.  You now have two options in how you learn about weight-loss surgery - in person or online.  Our objective is to ensure you have all of the information that you need to evaluate the advantages and obligations of this life changing procedure.  Please note that you are not alone in this process, and our experienced team is ready to assist and  answer all of your questions.  There are several ways to register for a seminar (either on-line or in person): 1)  Call [phone] 2) Go on-line to [facility] and register for either type of seminar.  [email or web address]

## 2017-03-31 NOTE — Progress Notes (Signed)
Subjective:    Patient ID: Monica Buchanan, female    DOB: 1976/05/27, 41 y.o.   MRN: [id number]  HPI Pt returns for f/u of reactive hypoglycemia; (she is G2P2 (C-section in [place]); during the pregnancy, she had intermittent reactive hypoglycemia; she does not want any more pregnancies (she has IUD); she had gastric bypass surgery in 2008, in [place]).  Since then, she has lost 90 lbs; in 2018, acarbose was changed to pioglitizone, due to abd bloating).   She stopped pioglitizone, due to nausea.  She was feeling well until 2 weeks ago, when she developed moderate tremor of the hands, and assoc cold intolerance.  Since then, she has had these sxs 2-3 times per week.  They happen within 1 hr of a meal.  It is relieved by eating.  She did not check cbg, as she had run out of strips.  Past Medical History:  Diagnosis Date  . Anemia   . Decreased fetal movement 05/08/2015  . Diabetes mellitus without complication ([place])    gestational per OR consent order  . H/O infertility   . Medical history non-contributory   . Sciatica     Past Surgical History:  Procedure Laterality Date  . ABDOMINAL SURGERY  MAY 2008   GASTRIC BYPASS  . CESAREAN SECTION     x1  . CESAREAN SECTION N/A 05/18/2015   Procedure: Repeat CESAREAN SECTION;  Surgeon: Servando Salina, MD;  Location: [facility];  Service: Obstetrics;  Laterality: N/A;  EDD: 05/21/15  . KNEE SURGERY Right     Social History   Social History  . Marital status: Divorced    Spouse name: N/A  . Number of children: N/A  . Years of education: N/A   Occupational History  . Not on file.   Social History Main Topics  . Smoking status: Former Smoker    Types: Cigarettes    Quit date: 03/14/2014  . Smokeless tobacco: Never Used  . Alcohol use No  . Drug use: No  . Sexual activity: Yes   Other Topics Concern  . Not on file   Social History Narrative  . No narrative on file    Current Outpatient Prescriptions on File Prior to Visit    Medication Sig Dispense Refill  . BAYER MICROLET LANCETS lancets Use to check blood sugar 1 time per day. 100 each 1  . ibuprofen (ADVIL,MOTRIN) 800 MG tablet Take 1 tablet (800 mg total) by mouth 3 (three) times daily. (Patient not taking: Reported on 03/31/2017) 21 tablet 0  . lidocaine (LIDODERM) 5 % Place 1 patch onto the skin daily. Remove & Discard patch within 12 hours or as directed by MD (Patient not taking: Reported on 03/31/2017) 30 patch 0  . methocarbamol (ROBAXIN) 500 MG tablet Take 1 tablet (500 mg total) by mouth 2 (two) times daily. (Patient not taking: Reported on 03/31/2017) 20 tablet 0   No current facility-administered medications on file prior to visit.     No Known Allergies  Family History  Problem Relation Age of Onset  . Diabetes Mother   . Hypertension Father   . Diabetes Maternal Grandmother   . Diabetes Paternal Grandmother   . Polycystic ovary syndrome Neg Hx     BP 138/88   Pulse 93   Wt (!) 300 lb 6.4 oz (136.3 kg)   SpO2 98%   BMI 39.63 kg/m   Review of Systems She has regained weight.  Denies LOC.     Objective:  Physical Exam VITAL SIGNS:  See vs page GENERAL: no distress Ext: no edema  Lab Results  Component Value Date   HGBA1C 6.4 03/31/2017      Assessment & Plan:  Reactive hypoglycemia: recurrent Hyperglycemia: I told pt she prob has DM. If not, she will develop soon, nless she resumes medication, or loses weight Obesity: persistent.  Patient Instructions  I have sent a prescription to your pharmacy, for the strips.   I have also sent a prescription to your pharmacy, to resume pioglitizone at 1/2 pill per day.  Please come back for a follow-up appointment in 6 months.     Bariatric Surgery You have so much to gain by losing weight.  You may have already tried every diet and exercise plan imaginable.  And, you may have sought advice from your family physician, too.   Sometimes, in spite of such diligent efforts, you may not  be able to achieve long-term results by yourself.  In cases of severe obesity, bariatric or weight loss surgery is a proven method of achieving long-term weight control.  Our Services Our bariatric surgery programs offer our patients new hope and long-term weight-loss solution.  Since introducing our services in 2003, we have conducted more than 2,400 successful procedures.  Our program is designated as a Programmer, multimedia by the Metabolic and Bariatric Surgery Accreditation and Quality Improvement Program (MBSAQIP), a IT trainer that sets rigorous patient safety and outcome standards.  Our program is also designated as a Ecologist by [employer].   Our exceptional weight-loss surgery team specializes in diagnosis, treatment, follow-up care, and ongoing support for our patients with severe weight loss challenges.  We currently offer laparoscopic sleeve gastrectomy, gastric bypass, and adjustable gastric band (LAP-BAND).    Attend our [place] Choosing to undergo a bariatric procedure is a big decision, and one that should not be taken lightly.  You now have two options in how you learn about weight-loss surgery - in person or online.  Our objective is to ensure you have all of the information that you need to evaluate the advantages and obligations of this life changing procedure.  Please note that you are not alone in this process, and our experienced team is ready to assist and answer all of your questions.  There are several ways to register for a seminar (either on-line or in person): 1)  Call [phone] 2) Go on-line to [facility] and register for either type of seminar.  [email or web address]

## 2017-06-09 DIAGNOSIS — F411 Generalized anxiety disorder: Secondary | ICD-10-CM | POA: Diagnosis not present

## 2017-06-09 DIAGNOSIS — F331 Major depressive disorder, recurrent, moderate: Secondary | ICD-10-CM | POA: Diagnosis not present

## 2017-06-17 DIAGNOSIS — F411 Generalized anxiety disorder: Secondary | ICD-10-CM | POA: Diagnosis not present

## 2017-06-17 DIAGNOSIS — F331 Major depressive disorder, recurrent, moderate: Secondary | ICD-10-CM | POA: Diagnosis not present

## 2017-06-20 DIAGNOSIS — F331 Major depressive disorder, recurrent, moderate: Secondary | ICD-10-CM | POA: Diagnosis not present

## 2017-06-20 DIAGNOSIS — F411 Generalized anxiety disorder: Secondary | ICD-10-CM | POA: Diagnosis not present

## 2017-06-24 DIAGNOSIS — F411 Generalized anxiety disorder: Secondary | ICD-10-CM | POA: Diagnosis not present

## 2017-06-24 DIAGNOSIS — F331 Major depressive disorder, recurrent, moderate: Secondary | ICD-10-CM | POA: Diagnosis not present

## 2017-07-02 DIAGNOSIS — F411 Generalized anxiety disorder: Secondary | ICD-10-CM | POA: Diagnosis not present

## 2017-07-02 DIAGNOSIS — F331 Major depressive disorder, recurrent, moderate: Secondary | ICD-10-CM | POA: Diagnosis not present

## 2017-07-07 DIAGNOSIS — F411 Generalized anxiety disorder: Secondary | ICD-10-CM | POA: Diagnosis not present

## 2017-07-07 DIAGNOSIS — F331 Major depressive disorder, recurrent, moderate: Secondary | ICD-10-CM | POA: Diagnosis not present

## 2017-08-04 DIAGNOSIS — F331 Major depressive disorder, recurrent, moderate: Secondary | ICD-10-CM | POA: Diagnosis not present

## 2017-08-04 DIAGNOSIS — F411 Generalized anxiety disorder: Secondary | ICD-10-CM | POA: Diagnosis not present

## 2017-08-05 DIAGNOSIS — F411 Generalized anxiety disorder: Secondary | ICD-10-CM | POA: Diagnosis not present

## 2017-08-05 DIAGNOSIS — F331 Major depressive disorder, recurrent, moderate: Secondary | ICD-10-CM | POA: Diagnosis not present

## 2017-08-25 ENCOUNTER — Encounter: Payer: Self-pay | Admitting: Obstetrics & Gynecology

## 2017-08-25 ENCOUNTER — Ambulatory Visit: Payer: BLUE CROSS/BLUE SHIELD | Admitting: Obstetrics & Gynecology

## 2017-08-25 VITALS — BP 128/84

## 2017-08-25 DIAGNOSIS — R8290 Unspecified abnormal findings in urine: Secondary | ICD-10-CM

## 2017-08-25 DIAGNOSIS — N898 Other specified noninflammatory disorders of vagina: Secondary | ICD-10-CM | POA: Diagnosis not present

## 2017-08-25 DIAGNOSIS — L309 Dermatitis, unspecified: Secondary | ICD-10-CM

## 2017-08-25 NOTE — Progress Notes (Signed)
    CHIQUITA HECKERT 1976/05/08 [id number]        41 y.o.  [id number]   RP:  Vulvar/rectal itching and dark urine with odor  HPI:  Symptoms improving after using an anti-itch cream and apple vinegar on vulva/anal area.  Had dark urine this morning with odor, but no pain with micturition and no urinary frequency.  No pelvic pain.  No abnormal vaginal discharge.  C/O ithchy patches on hands, symptoms worsen when exposed to heat x many months.  H/O Eczema.  Patient is on Mirena IUD on which she is doing well.,  Overdue for annual gynecologic exam last one November 2017.  Past medical history,surgical history, problem list, medications, allergies, family history and social history were all reviewed and documented in the EPIC chart.  Directed ROS with pertinent positives and negatives documented in the history of present illness/assessment and plan.  Exam:  Vitals:   08/25/17 1553  BP: 128/84   General appearance:  Normal  Skin:  Hands with Eczema like dry/inflamed lesions  CVAT neg bilaterally  Gyn exam:  Vulva normal.  Speculum:  Cervix/Vagina normal.  Wet prep done.  U/A RBC 3-10, few bacteria.  Otherwise normal.  Sent for Urine culture.  Wet prep neg.    Assessment/Plan:  41 y.o. G2P2002   1. Abnormal urine odor Very mild perturbations in urine analysis.  Decision to await urine culture before treating.  Good hydration with water recommended. - Urinalysis with Culture Reflex - Urine Culture  2. Vaginal pruritus Wet prep negative.  Normal valve on exam.  Patient is currently as symptomatic.  Probable contact dermatitis, resolved.  Reassured. - WET PREP FOR TRICH, YEAST, CLUE  3. Eczema, unspecified type Refer to dermatologist.  Other orders - REFLEXIVE URINE CULTURE  Counseling on above issues more than 50% for 15 minutes.  Princess Bruins MD, 4:10 PM 08/25/2017

## 2017-08-26 LAB — WET PREP FOR TRICH, YEAST, CLUE

## 2017-08-27 ENCOUNTER — Other Ambulatory Visit: Payer: Self-pay | Admitting: Obstetrics & Gynecology

## 2017-08-27 LAB — URINALYSIS W MICROSCOPIC + REFLEX CULTURE
BILIRUBIN URINE: NEGATIVE
Glucose, UA: NEGATIVE
Hyaline Cast: NONE SEEN /LPF
LEUKOCYTE ESTERASE: NEGATIVE
NITRITES URINE, INITIAL: NEGATIVE
PH: 5.5 (ref 5.0–8.0)
Protein, ur: NEGATIVE
SPECIFIC GRAVITY, URINE: 1.025 (ref 1.001–1.03)

## 2017-08-27 LAB — URINE CULTURE
MICRO NUMBER: 81421688
SPECIMEN QUALITY:: ADEQUATE

## 2017-08-27 LAB — NO CULTURE INDICATED

## 2017-08-27 MED ORDER — SULFAMETHOXAZOLE-TRIMETHOPRIM 800-160 MG PO TABS: 1.0000 | ORAL_TABLET | Freq: Two times a day (BID) | ORAL | 0 refills | Status: DC

## 2017-08-30 ENCOUNTER — Encounter: Payer: Self-pay | Admitting: Obstetrics & Gynecology

## 2017-08-30 NOTE — Patient Instructions (Signed)
1. Abnormal urine odor Very mild perturbations in urine analysis.  Decision to await urine culture before treating.  Good hydration with water recommended. - Urinalysis with Culture Reflex - Urine Culture  2. Vaginal pruritus Wet prep negative.  Normal valve on exam.  Patient is currently as symptomatic.  Probable contact dermatitis, resolved.  Reassured. - WET PREP FOR TRICH, YEAST, CLUE  3. Eczema, unspecified type Refer to dermatologist.  Other orders - REFLEXIVE URINE CULTURE  Maudry Mayhew, fue un placer conocerla hoy!  Voy a informarla de Financial trader.

## 2017-09-03 ENCOUNTER — Telehealth: Payer: Self-pay | Admitting: *Deleted

## 2017-09-03 NOTE — Telephone Encounter (Signed)
patient scheduled on 09/28/17 @ 9:30am with PA Claiborne Billings at [address] [employer] [phone]. Notes faxed to [phone]. Will route to claudia to relay.

## 2017-09-03 NOTE — Telephone Encounter (Signed)
-----   Message from Princess Bruins, MD sent at 08/25/2017  4:38 PM EST ----- Regarding: Refer to [facility] Refer to [facility] for evaluation/management of probable Eczema on hands.

## 2017-09-04 NOTE — Telephone Encounter (Signed)
Patient informed. 

## 2017-09-17 DIAGNOSIS — F411 Generalized anxiety disorder: Secondary | ICD-10-CM | POA: Diagnosis not present

## 2017-09-17 DIAGNOSIS — F331 Major depressive disorder, recurrent, moderate: Secondary | ICD-10-CM | POA: Diagnosis not present

## 2017-09-18 DIAGNOSIS — F411 Generalized anxiety disorder: Secondary | ICD-10-CM | POA: Diagnosis not present

## 2017-09-18 DIAGNOSIS — F331 Major depressive disorder, recurrent, moderate: Secondary | ICD-10-CM | POA: Diagnosis not present

## 2017-09-28 DIAGNOSIS — F331 Major depressive disorder, recurrent, moderate: Secondary | ICD-10-CM | POA: Diagnosis not present

## 2017-09-28 DIAGNOSIS — F411 Generalized anxiety disorder: Secondary | ICD-10-CM | POA: Diagnosis not present

## 2017-10-01 ENCOUNTER — Ambulatory Visit: Payer: BLUE CROSS/BLUE SHIELD | Admitting: Obstetrics & Gynecology

## 2017-10-01 ENCOUNTER — Encounter: Payer: Self-pay | Admitting: Obstetrics & Gynecology

## 2017-10-01 VITALS — BP 134/86 | Ht 72.0 in | Wt 300.0 lb

## 2017-10-01 DIAGNOSIS — Z30431 Encounter for routine checking of intrauterine contraceptive device: Secondary | ICD-10-CM | POA: Diagnosis not present

## 2017-10-01 DIAGNOSIS — Z01419 Encounter for gynecological examination (general) (routine) without abnormal findings: Secondary | ICD-10-CM | POA: Diagnosis not present

## 2017-10-01 DIAGNOSIS — E66813 Obesity, class 3: Secondary | ICD-10-CM

## 2017-10-01 DIAGNOSIS — K644 Residual hemorrhoidal skin tags: Secondary | ICD-10-CM | POA: Diagnosis not present

## 2017-10-01 DIAGNOSIS — Z6841 Body Mass Index (BMI) 40.0 and over, adult: Secondary | ICD-10-CM | POA: Diagnosis not present

## 2017-10-01 DIAGNOSIS — Z113 Encounter for screening for infections with a predominantly sexual mode of transmission: Secondary | ICD-10-CM

## 2017-10-01 DIAGNOSIS — R6882 Decreased libido: Secondary | ICD-10-CM

## 2017-10-01 DIAGNOSIS — Z1151 Encounter for screening for human papillomavirus (HPV): Secondary | ICD-10-CM

## 2017-10-01 MED ORDER — HYDROCORTISONE 2.5 % RE CREA: 1.0000 "application " | TOPICAL_CREAM | Freq: Two times a day (BID) | RECTAL | 2 refills | Status: DC

## 2017-10-01 NOTE — Patient Instructions (Signed)
1. Encounter for routine gynecological examination with Papanicolaou smear of cervix Normal gynecologic exam.  Pap test with high risk HPV done.  Breast exam normal.  Will schedule screening mammogram at the breast center.  Health labs with family physician. - Pap IG, CT/NG NAA, and HPV (high risk)  2. Encounter for routine checking of intrauterine contraceptive device (IUD) Marinol IUD since November 2016.  Well-tolerated and in good position.  3. Screen for STD (sexually transmitted disease) Gonorrhea and Chlamydia done on Pap test.  Declines rest of the STI screen. - Pap IG, CT/NG NAA, and HPV (high risk)  4. Decreased libido Counseling on decreased libido.  Recommend open communication with husband and organizing special couple times.  If not sufficient, patient will call back for referral to a psychotherapist.  5. Class 3 severe obesity due to excess calories without serious comorbidity with body mass index (BMI) of 40.0 to 44.9 in adult [facility]) Recommend low calorie/carb diet, like the [employer].  Regular aerobic physical activity 5 times a week and weightlifting every 2 days recommended.  6. Special screening examination for human papillomavirus (HPV) - Pap IG, CT/NG NAA, and HPV (high risk)  7. External hemorrhoids without complication Small nonthrombosed external hemorrhoid.  No rectal bleeding.  Intermittently symptomatic with itching.  Will try Anusol HC.  Other orders - hydrocortisone (ANUSOL-HC) 2.5 % rectal cream; Place 1 application rectally 2 (two) times daily for 14 days.   Marijean Niemann un placer verle hoy!  Voy a informarle de sus [employer].  [place] (Health Maintenance, Female) Un estilo de vida saludable y los cuidados preventivos pueden favorecer considerablemente a la salud y Musician. Pregunte a su mdico cul es el cronograma de exmenes peridicos apropiado para usted. Esta es una buena oportunidad para consultarlo  sobre cmo prevenir enfermedades y [place] sano. Adems de los controles, hay muchas otras cosas que puede hacer usted mismo. Los expertos han realizado numerosas investigaciones [employer]vin[employer]itor cambios en el estilo de vida y las medidas de prevencin que, [place], lo ayudarn a mantenerse sano. Solicite a su mdico ms informacin. EL PESO Y LA DIETA Consuma una dieta saludable.  Asegrese de [employer] Stores verduras, frutas, productos lcteos de bajo contenido de [place] y Advertising account planner.  No consuma muchos alimentos de alto contenido de grasas slidas, azcares agregados o sal.  Realice actividad fsica con regularidad. Esta es una de las prcticas ms importantes que puede hacer por su salud. ? La Delorise Shiner de los adultos deben hacer ejercicio durante al menos 153mnutos por semana. El ejercicio debe aumentar la frecuencia cardaca y pActorla transpiracin (ejercicio de i[place]. ? La mayora de los adultos tambin deben hacer ejercicios de elongacin al mToysRusveces a la semana. Agregue esto al su plan de ejercicio de intensidad moderada. Mantenga un peso saludable.  El ndice de masa corporal ([facility] es una medida que puede utilizarse para identificar posibles problemas de pGreen Level Proporciona una estimacin de la grasa corporal basndose en el peso y la altura. Su mdico puede ayudarle a dRadiation protection practitionerIColonay a lScientist, forensico mTheatre managerun peso saludable.  Para las mujeres de 20aos o ms: ? Un IS[facility]ate Street Surgery Center LLC Iu Healthmenor de 18,5 se considera bajo peso. ? Un IApollo Hospitalentre 18,5 y 24,9 es normal. ? Un IUniversity Of New Mexico Hospitalentre 25 y 29,9 se considera sobrepeso. ? Un IMC de 30 o ms se considera obesidad. Observe los niveles de colesterol y lpidos en la sangre.  Debe comenzar  a realizarse [employer] de lpidos y Research officer, trade union en la sangre a los 20aos y luego repetirlos cada 46aos.  Es posible que Automotive engineer los niveles de colesterol con mayor frecuencia si: ? Sus niveles de lpidos y colesterol son  altos. ? Es mayor de [id number]. ? Presenta un alto riesgo de padecer enfermedades cardacas. DETECCIN DE CNCER Cncer de pulmn  Se recomienda realizar exmenes de deteccin de cncer de pulmn a personas adultas entre 24 y 30 aos que estn en riesgo de Horticulturist, commercial de pulmn por sus antecedentes de consumo de tabaco.  Se recomienda una tomografa computarizada de baja dosis de los pulmones todos los aos a las personas que: ? Fuman actualmente. ? Hayan dejado el hbito en algn momento en los ltimos 15aos. ? Hayan fumado durante 30aos un paquete diario. Un paquete-ao equivale a fumar un promedio de un paquete de cigarrillos diario durante un ao.  Los exmenes de deteccin anuales deben continuar hasta que hayan pasado 15aos desde que dej de fumar.  Ya no debern realizarse si tiene un problema de salud que le impida recibir tratamiento para Science writer de pulmn. Cncer de mama  Practique la autoconciencia de la mama. Esto significa reconocer la apariencia normal de sus mamas y cmo las siente.  Tambin significa realizar autoexmenes regulares de [employer]. Informe a su mdico sobre cualquier cambio, sin importar cun pequeo sea.  Si tiene entre 20 y 68 aos, un mdico debe realizarle un examen clnico de las mamas como parte del examen regular de [place], cada 1 a 3aos.  Si tiene 40aos o ms, debe Information systems manager clnico de las Microsoft. Tambin considere realizarse una [place]) todos los [place].  Si tiene antecedentes familiares de cncer de mama, hable con su mdico para someterse a un estudio gentico.  Si tiene alto riesgo de Chief Financial Officer de mama, hable con su mdico para someterse a Public house manager y [employer].  La evaluacin del gen del cncer de mama (BRCA) se recomienda a mujeres que tengan familiares con cnceres relacionados con el BRCA. Los cnceres relacionados con el BRCA  incluyen los siguientes: ? [place]. ? Ovario. ? Trompas. ? Cnceres de peritoneo.  Los resultados de la evaluacin determinarn la necesidad de asesoramiento gentico y de [place] de BRCA1 y BRCA2. Cncer de cuello del tero El mdico puede recomendarle que se haga pruebas peridicas de deteccin de cncer de los rganos de la pelvis (ovarios, tero y vagina). Estas pruebas incluyen un examen plvico, que abarca controlar si se produjeron cambios microscpicos en la superficie del cuello del tero (prueba de Papanicolaou). Pueden recomendarle que se haga estas pruebas cada 3aos, a partir de los 21aos.  A las mujeres que tienen entre 30 y 7aos, los mdicos pueden recomendarles que se sometan a exmenes plvicos y pruebas de Papanicolaou cada 85aos, o a la prueba de Papanicolaou y el examen plvico en combinacin con estudios de deteccin del virus del papiloma humano (VPH) cada 5aos. Algunos tipos de VPH aumentan el riesgo de Chief Financial Officer de cuello del tero. La prueba para la deteccin del VPH tambin puede realizarse a mujeres de cualquier edad cuyos resultados de la prueba de Papanicolaou no sean claros.  Es posible que otros mdicos no recomienden exmenes de deteccin a mujeres no embarazadas que se consideran sujetos de bajo riesgo de Chief Financial Officer de pelvis y que no tienen sntomas. Pregntele al mdico si un examen plvico de deteccin es  adecuado para usted.  Si ha recibido un tratamiento para Science writer cervical o una enfermedad que podra causar cncer, necesitar realizarse una prueba de Papanicolaou y controles durante al menos 46 aos de concluido el [place]. Si no se ha hecho el Papanicolaou con regularidad, debern volver a evaluarse los factores de riesgo (como tener un nuevo compaero sexual), para Teacher, adult education si debe realizarse los estudios nuevamente. Algunas mujeres sufren problemas mdicos que aumentan la probabilidad de Museum/gallery curator cncer de cuello del tero. En estos  casos, el mdico podr QUALCOMM se realicen controles y pruebas de Papanicolaou con ms frecuencia. Cncer colorrectal  Este tipo de cncer puede detectarse y a menudo prevenirse.  Por lo general, los estudios de rutina se deben Medical laboratory scientific officer a Field seismologist a Proofreader de los 26 aos y Alden 67 aos.  Sin embargo, el mdico podr aconsejarle que lo haga antes, si tiene factores de riesgo para el cncer de colon.  Tambin puede recomendarle que use un kit de prueba para Hydrologist en la materia fecal.  Es posible que se use una pequea cmara en el extremo de un tubo para examinar directamente el colon (sigmoidoscopia o colonoscopia) a fin de Hydrographic surveyor formas tempranas de cncer colorrectal.  Los exmenes de rutina generalmente comienzan a los 44aos.  El examen directo del colon se debe repetir cada 5 a 10aos hasta los 75aos. Sin embargo, es posible que se realicen exmenes con mayor frecuencia, si se detectan formas tempranas de plipos precancerosos o pequeos bultos. Cncer de piel  Revise la piel de la cabeza a los pies con regularidad.  Informe a su mdico si aparecen nuevos lunares o los que tiene se modifican, especialmente en su forma y color.  Tambin notifique al mdico si tiene un lunar que es ms grande que el tamao de una goma de lpiz.  Siempre use pantalla solar. Aplique pantalla solar de Kerry Dory y repetida a lo largo del Training and development officer.  Protjase usando mangas y [employer], un sombrero de ala ancha y gafas para el sol, siempre que se encuentre en el exterior. ENFERMEDADES CARDACAS, DIABETES E HIPERTENSIN ARTERIAL  La hipertensin arterial causa enfermedades cardacas y [place] el riesgo de ictus. La hipertensin arterial es ms probable en los siguientes casos: ? Las personas que tienen la presin arterial en el extremo del rango normal (100-139/85-89 mm Hg). ? [employer]ark[employer] Corporation con sobrepeso u obesidad. ? Scientist, water quality.  Si usted tiene entre 18 y  39 aos, debe medirse la presin arterial cada 3 a 5 aos. Si usted tiene 40 aos o ms, debe medirse la presin arterial [employer]lett[employer]. Debe medirse la presin arterial dos veces: una vez cuando est en un hospital o una clnica y la otra vez cuando est en otro sitio. Registre el promedio de [employer] Stores. Para controlar su presin arterial cuando no est en un hospital o [place], puede usar lo siguiente: ? Jorje Guild automtica para medir la presin arterial en una farmacia. ? Un monitor para medir la presin arterial en el hogar.  Si tiene entre 7 y 89 aos, consulte a su mdico si debe tomar aspirina para prevenir el ictus.  Realcese exmenes de deteccin de la diabetes con regularidad. Esto incluye la toma de [place] de sangre para controlar el nivel de azcar en la sangre durante el [place]. ? Si tiene un peso normal y un bajo riesgo de padecer diabetes, realcese este anlisis cada tres aos despus de los 45aos. ? Si  tiene sobrepeso y un alto riesgo de padecer diabetes, considere someterse a este anlisis antes o con mayor frecuencia. PREVENCIN DE INFECCIONES HepatitisB  Si tiene un riesgo ms alto de Museum/gallery curator hepatitis B, debe someterse a un examen de deteccin de este virus. Se considera que tiene un alto riesgo de contraer hepatitis B si: ? Naci en un pas donde la hepatitis B es frecuente. Pregntele a su mdico qu pases son considerados de Public affairs consultant. ? Sus padres nacieron en un pas de alto riesgo y usted no recibi una vacuna que lo proteja contra la hepatitis B (vacuna contra la hepatitis B). ? [place]. ? [place] agujas para inyectarse drogas. ? Vive con alguien que tiene hepatitis B. ? Ha tenido sexo con alguien que tiene hepatitis B. ? Recibe tratamiento de hemodilisis. ? Toma ciertos medicamentos para el cncer, trasplante de rganos y afecciones autoinmunitarias. Hepatitis C  Se recomienda un anlisis de [place] para: ? Lowe[employer] 1945 y 1965. ? Todas las personas que tengan un riesgo de haber contrado hepatitis C. Enfermedades de transmisin sexual (ETS).  Debe realizarse pruebas de deteccin de enfermedades de transmisin sexual (ETS), incluidas gonorrea y clamidia si: ? Es sexualmente activo y es menor de 37CHY. ? Es mayor de 24aos, y Investment banker, operational informa que corre riesgo de tener este tipo de infecciones. ? La actividad sexual ha cambiado desde que le hicieron la ltima prueba de deteccin y tiene un riesgo mayor de Best boy clamidia o Radio broadcast assistant. Pregntele al mdico si usted tiene riesgo.  Si no tiene el VIH, pero corre riesgo de infectarse por el virus, se recomienda tomar diariamente un medicamento recetado para evitar la infeccin. Esto se conoce como profilaxis previa a la exposicin. Se considera que est en riesgo si: ? Es [place] sexualmente y no [place] preservativos habitualmente o no conoce el estado del VIH de sus Advertising copywriter. ? Se inyecta drogas. ? Es [place] sexualmente con Ardelia Mems pareja que tiene VIH. Consulte a su mdico para saber si tiene un alto riesgo de infectarse por el VIH. Si opta por comenzar la profilaxis previa a la exposicin, primero debe realizarse anlisis de deteccin del VIH. Luego, le harn anlisis cada 84mses mientras est tomando los medicamentos para la profilaxis previa a la exposicin. [facility] Si es premenopusica y puede quedar eClaypool solicite a su mdico asesoramiento previo a la concepcin.  Si puede quedar embarazada, tome 400 a [id number](mcg) de cido fAnheuser-Busch  Si desea evitar el embarazo, hable con su mdico sobre el control de la natalidad (anticoncepcin). OSTEOPOROSIS Y MENOPAUSIA  La osteoporosis es una enfermedad en la que los huesos pierden los minerales y la fuerza por el avance de la edad. El resultado pueden ser fracturas graves en los h[place] El riesgo de osteoporosis puede identificarse con uArdelia Memsprueba de densidad sea.  Si  tiene 65aos o ms, o si est en riesgo de sufrir osteoporosis y fracturas, pregunte a su mdico si debe someterse a exmenes.  Consulte a su mdico si debe tomar un suplemento de calcio o de vitamina D para reducir el riesgo de osteoporosis.  La menopausia puede presentar ciertos sntomas fsicos y rGaffer  La terapia de reemplazo hormonal puede reducir algunos de estos sntomas y rGaffer Consulte a su mdico para saber si la terapia de reemplazo hormonal es conveniente para usted. IChicolos estudios de rutina de la salud, dOcean Cityy de  la vista.  [place].  No consuma ningn producto que contenga tabaco, lo que incluye cigarrillos, tabaco de Higher education careers adviser o Psychologist, sport and exercise.  Si est embarazada, no beba alcohol.  Si est amamantando, reduzca el consumo de alcohol y la frecuencia con la que consume.  Si es mujer y no est embarazada limite el consumo de alcohol a no ms de 1 medida por da. Una medida equivale a 12onzas de cerveza, 5onzas de vino o 1onzas de bebidas alcohlicas de alta graduacin.  No consuma drogas.  No comparta agujas.  Solicite ayuda a su mdico si necesita apoyo o informacin para abandonar las drogas.  Informe a su mdico si a menudo se siente deprimido.  Notifique a su mdico si alguna vez ha sido vctima de abuso o si no se siente seguro en su hogar. Esta informacin no tiene Marine scientist el consejo del mdico. Asegrese de hacerle al mdico cualquier pregunta que tenga. Document Released: 08/14/2011 Document Revised: 09/15/2014 Document Reviewed: 05/29/2015 Elsevier Interactive Patient Education  [employer].

## 2017-10-01 NOTE — Progress Notes (Signed)
Monica Buchanan 28-Mar-1976 [id number]   History:    42 y.o. G2P2L2 Married x 9 years.  Daughter 81 yo, son 21 yo.  RP:  Established patient presenting for annual gyn exam  HPI: Well on Mirena IUD since November 2016.  Rare very light menses.  No pelvic pain.  Normal vaginal secretions.  Complains of decreased libido, but no pain with intercourse.  6-year-old son is very active and patient is working.  Breasts normal.  Urine and bowel movements normal.  But complains of occasional anal itching.  No blood in stools.  Health labs with family physician.  Past medical history,surgical history, family history and social history were all reviewed and documented in the EPIC chart.  Gynecologic History Patient's last menstrual period was 06/19/2017. Contraception: Mirena IUD x 07/2015 Last Pap: 2015. Results were: normal Last mammogram:  Never  Obstetric History OB History  Gravida Para Term Preterm AB Living  2 2 2  0 0 2  SAB TAB Ectopic Multiple Live Births  0 0 0 0 1    # Outcome Date GA Lbr Len/2nd Weight Sex Delivery Anes PTL Lv  2 Term 05/18/15 [redacted]w[redacted]d  9 lb 5.2 oz (4.23 kg) M CS-LTranv Spinal  LIV  1 Term                ROS: A ROS was performed and pertinent positives and negatives are included in the history.  GENERAL: No fevers or chills. HEENT: No change in vision, no earache, sore throat or sinus congestion. NECK: No pain or stiffness. CARDIOVASCULAR: No chest pain or pressure. No palpitations. PULMONARY: No shortness of breath, cough or wheeze. GASTROINTESTINAL: No abdominal pain, nausea, vomiting or diarrhea, melena or bright red blood per rectum. GENITOURINARY: No urinary frequency, urgency, hesitancy or dysuria. MUSCULOSKELETAL: No joint or muscle pain, no back pain, no recent trauma. DERMATOLOGIC: No rash, no itching, no lesions. ENDOCRINE: No polyuria, polydipsia, no heat or cold intolerance. No recent change in weight. HEMATOLOGICAL: No anemia or easy bruising or bleeding.  NEUROLOGIC: No headache, seizures, numbness, tingling or weakness. PSYCHIATRIC: No depression, no loss of interest in normal activity or change in sleep pattern.     Exam:   BP 134/86   Ht 6' (1.829 m)   Wt 300 lb (136.1 kg)   LMP 06/19/2017   BMI 40.69 kg/m   Body mass index is 40.69 kg/m.  General appearance : Well developed well nourished female. No acute distress HEENT: Eyes: no retinal hemorrhage or exudates,  Neck supple, trachea midline, no carotid bruits, no thyroidmegaly Lungs: Clear to auscultation, no rhonchi or wheezes, or rib retractions  Heart: Regular rate and rhythm, no murmurs or gallops Breast:Examined in sitting and supine position were symmetrical in appearance, no palpable masses or tenderness,  no skin retraction, no nipple inversion, no nipple discharge, no skin discoloration, no axillary or supraclavicular lymphadenopathy Abdomen: no palpable masses or tenderness, no rebound or guarding Extremities: no edema or skin discoloration or tenderness  Pelvic: Vulva normal  Bartholin, Urethra, Skene Glands: Within normal limits             Vagina: No gross lesions or discharge  Cervix: No gross lesions or discharge.  IUD strings not seen, but felt just inside EO.  Pap/HR HPV/Gono-Chlam done.  Uterus  AV, normal size, shape and consistency, non-tender and mobile  Adnexa  Without masses or tenderness  Anus with a small non-thrombosed hemorrhoid  Assessment/Plan:  42 y.o. female for annual exam  1. Encounter for routine gynecological examination with Papanicolaou smear of cervix Normal gynecologic exam.  Pap test with high risk HPV done.  Breast exam normal.  Will schedule screening mammogram at the breast center.  Health labs with family physician. - Pap IG, CT/NG NAA, and HPV (high risk)  2. Encounter for routine checking of intrauterine contraceptive device (IUD) Marinol IUD since November 2016.  Well-tolerated and in good position.  3. Screen for STD  (sexually transmitted disease) Gonorrhea and Chlamydia done on Pap test.  Declines rest of the STI screen. - Pap IG, CT/NG NAA, and HPV (high risk)  4. Decreased libido Counseling on decreased libido.  Recommend open communication with husband and organizing special couple times.  If not sufficient, patient will call back for referral to a psychotherapist.  5. Class 3 severe obesity due to excess calories without serious comorbidity with body mass index (BMI) of 40.0 to 44.9 in adult [facility]) Recommend low calorie/carb diet, like the [employer].  Regular aerobic physical activity 5 times a week and weightlifting every 2 days recommended.  6. Special screening examination for human papillomavirus (HPV) - Pap IG, CT/NG NAA, and HPV (high risk)  7. External hemorrhoids without complication Small nonthrombosed external hemorrhoid.  No rectal bleeding.  Intermittently symptomatic with itching.  Will try Anusol HC.  Other orders - hydrocortisone (ANUSOL-HC) 2.5 % rectal cream; Place 1 application rectally 2 (two) times daily for 14 days.  Counseling on above issues more than 50% for 10 minutes.  Princess Bruins MD, 3:14 PM 10/01/2017

## 2017-10-05 ENCOUNTER — Ambulatory Visit: Payer: BLUE CROSS/BLUE SHIELD | Admitting: Endocrinology

## 2017-10-05 LAB — PAP IG, CT-NG NAA, HPV HIGH-RISK
C. trachomatis RNA, TMA: NOT DETECTED
HPV DNA High Risk: NOT DETECTED
N. gonorrhoeae RNA, TMA: NOT DETECTED

## 2017-10-06 DIAGNOSIS — F411 Generalized anxiety disorder: Secondary | ICD-10-CM | POA: Diagnosis not present

## 2017-10-06 DIAGNOSIS — F331 Major depressive disorder, recurrent, moderate: Secondary | ICD-10-CM | POA: Diagnosis not present

## 2017-10-13 DIAGNOSIS — F331 Major depressive disorder, recurrent, moderate: Secondary | ICD-10-CM | POA: Diagnosis not present

## 2017-10-13 DIAGNOSIS — F411 Generalized anxiety disorder: Secondary | ICD-10-CM | POA: Diagnosis not present

## 2017-10-26 ENCOUNTER — Ambulatory Visit (INDEPENDENT_AMBULATORY_CARE_PROVIDER_SITE_OTHER): Payer: BLUE CROSS/BLUE SHIELD | Admitting: Endocrinology

## 2017-10-26 ENCOUNTER — Encounter: Payer: Self-pay | Admitting: Endocrinology

## 2017-10-26 VITALS — BP 118/72 | HR 72 | Wt 294.6 lb

## 2017-10-26 DIAGNOSIS — K912 Postsurgical malabsorption, not elsewhere classified: Secondary | ICD-10-CM | POA: Diagnosis not present

## 2017-10-26 LAB — POCT GLYCOSYLATED HEMOGLOBIN (HGB A1C): HEMOGLOBIN A1C: 6.3

## 2017-10-26 MED ORDER — GLUCOSE BLOOD VI STRP: 1.0000 | ORAL_STRIP | Freq: Every day | 3 refills | Status: DC

## 2017-10-26 NOTE — Patient Instructions (Signed)
I agree with your plan to work towards further weight-loss surgery.  Also, please continue your weight loss efforts.  Please come back for a follow-up appointment in 6 months.

## 2017-10-26 NOTE — Progress Notes (Signed)
Subjective:    Patient ID: Monica Buchanan, female    DOB: March 28, 1976, 42 y.o.   MRN: [id number]  HPI Pt returns for f/u of reactive hypoglycemia; (she is G2P2 (C-section in [place]); during the pregnancy, she had intermittent reactive hypoglycemia; she does not want any more pregnancies (she has IUD); she had gastric bypass surgery in 2008, in [place]).  Since then, she has lost 90 lbs; in 2018, she did not tolerate acarbose (abd bloating), or pioglitizone (nausea); she is on no medication now).  pt states she feels well in general.  In particular, she denies diarrhea.    Past Medical History:  Diagnosis Date  . Anemia   . Decreased fetal movement 05/08/2015  . Diabetes mellitus without complication ([place])    gestational per OR consent order  . H/O infertility   . Medical history non-contributory   . Sciatica     Past Surgical History:  Procedure Laterality Date  . ABDOMINAL SURGERY  MAY 2008   GASTRIC BYPASS  . CESAREAN SECTION     x1  . CESAREAN SECTION N/A 05/18/2015   Procedure: Repeat CESAREAN SECTION;  Surgeon: Servando Salina, MD;  Location: [facility];  Service: Obstetrics;  Laterality: N/A;  EDD: 05/21/15  . KNEE SURGERY Right     Social History   Socioeconomic History  . Marital status: Divorced    Spouse name: Not on file  . Number of children: Not on file  . Years of education: Not on file  . Highest education level: Not on file  Social Needs  . Financial resource strain: Not on file  . Food insecurity - worry: Not on file  . Food insecurity - inability: Not on file  . Transportation needs - medical: Not on file  . Transportation needs - non-medical: Not on file  Occupational History  . Not on file  Tobacco Use  . Smoking status: Current Some Day Smoker    Types: Cigarettes  . Smokeless tobacco: Never Used  Substance and Sexual Activity  . Alcohol use: No    Alcohol/week: 0.0 oz  . Drug use: No  . Sexual activity: Yes    Partners: Male    Birth  control/protection: IUD    Comment: 1St intercourse-15, partners- 59, married- 45 yrs   Other Topics Concern  . Not on file  Social History Narrative  . Not on file    No current outpatient medications on file prior to visit.   No current facility-administered medications on file prior to visit.     No Known Allergies  Family History  Problem Relation Age of Onset  . Diabetes Mother   . Hypertension Father   . Diabetes Maternal Grandmother   . Diabetes Paternal Grandmother   . Polycystic ovary syndrome Neg Hx     BP 118/72 (BP Location: Left Arm, Patient Position: Sitting, Cuff Size: Normal)   Pulse 72   Wt 294 lb 9.6 oz (133.6 kg)   SpO2 97%   BMI 39.95 kg/m    Review of Systems She has lost a few lbs.      Objective:   Physical Exam VITAL SIGNS:  See vs page GENERAL: no distress.  Pulses: dorsalis pedis intact bilat.   MSK: no deformity of the feet.  CV: no leg edema.  Skin:  no ulcer on the feet.  normal color and temp on the feet. Neuro: sensation is intact to touch on the feet.   A1c=6.3%    Assessment & Plan:  Hyperglycemia, with reactive hypoglycemia--both improved.   Patient Instructions  I agree with your plan to work towards further weight-loss surgery.  Also, please continue your weight loss efforts.  Please come back for a follow-up appointment in 6 months.

## 2017-10-27 DIAGNOSIS — L209 Atopic dermatitis, unspecified: Secondary | ICD-10-CM | POA: Diagnosis not present

## 2017-11-17 DIAGNOSIS — F331 Major depressive disorder, recurrent, moderate: Secondary | ICD-10-CM | POA: Diagnosis not present

## 2017-11-17 DIAGNOSIS — F411 Generalized anxiety disorder: Secondary | ICD-10-CM | POA: Diagnosis not present

## 2017-11-23 ENCOUNTER — Other Ambulatory Visit: Payer: Self-pay | Admitting: Obstetrics & Gynecology

## 2017-11-23 DIAGNOSIS — Z1239 Encounter for other screening for malignant neoplasm of breast: Secondary | ICD-10-CM

## 2017-12-07 ENCOUNTER — Ambulatory Visit: Payer: BLUE CROSS/BLUE SHIELD

## 2017-12-28 ENCOUNTER — Ambulatory Visit
Admission: RE | Admit: 2017-12-28 | Discharge: 2017-12-28 | Disposition: A | Discharge status: Home / Self Care | Payer: BLUE CROSS/BLUE SHIELD | Source: Ambulatory Visit | Attending: Obstetrics & Gynecology | Admitting: Obstetrics & Gynecology

## 2017-12-28 DIAGNOSIS — Z1239 Encounter for other screening for malignant neoplasm of breast: Secondary | ICD-10-CM

## 2017-12-28 DIAGNOSIS — Z1231 Encounter for screening mammogram for malignant neoplasm of breast: Secondary | ICD-10-CM | POA: Diagnosis not present

## 2018-03-15 DIAGNOSIS — M9903 Segmental and somatic dysfunction of lumbar region: Secondary | ICD-10-CM | POA: Diagnosis not present

## 2018-03-15 DIAGNOSIS — M5416 Radiculopathy, lumbar region: Secondary | ICD-10-CM | POA: Diagnosis not present

## 2018-03-15 DIAGNOSIS — M5386 Other specified dorsopathies, lumbar region: Secondary | ICD-10-CM | POA: Diagnosis not present

## 2018-03-16 DIAGNOSIS — M5416 Radiculopathy, lumbar region: Secondary | ICD-10-CM | POA: Diagnosis not present

## 2018-03-16 DIAGNOSIS — M5386 Other specified dorsopathies, lumbar region: Secondary | ICD-10-CM | POA: Diagnosis not present

## 2018-03-16 DIAGNOSIS — M9904 Segmental and somatic dysfunction of sacral region: Secondary | ICD-10-CM | POA: Diagnosis not present

## 2018-03-17 DIAGNOSIS — M5386 Other specified dorsopathies, lumbar region: Secondary | ICD-10-CM | POA: Diagnosis not present

## 2018-03-17 DIAGNOSIS — M9903 Segmental and somatic dysfunction of lumbar region: Secondary | ICD-10-CM | POA: Diagnosis not present

## 2018-03-17 DIAGNOSIS — M5416 Radiculopathy, lumbar region: Secondary | ICD-10-CM | POA: Diagnosis not present

## 2018-03-17 DIAGNOSIS — M9904 Segmental and somatic dysfunction of sacral region: Secondary | ICD-10-CM | POA: Diagnosis not present

## 2018-03-18 DIAGNOSIS — M9903 Segmental and somatic dysfunction of lumbar region: Secondary | ICD-10-CM | POA: Diagnosis not present

## 2018-03-18 DIAGNOSIS — M5386 Other specified dorsopathies, lumbar region: Secondary | ICD-10-CM | POA: Diagnosis not present

## 2018-03-18 DIAGNOSIS — M9908 Segmental and somatic dysfunction of rib cage: Secondary | ICD-10-CM | POA: Diagnosis not present

## 2018-03-18 DIAGNOSIS — M9902 Segmental and somatic dysfunction of thoracic region: Secondary | ICD-10-CM | POA: Diagnosis not present

## 2018-04-26 ENCOUNTER — Ambulatory Visit: Payer: BLUE CROSS/BLUE SHIELD | Admitting: Endocrinology

## 2018-04-26 DIAGNOSIS — Z0289 Encounter for other administrative examinations: Secondary | ICD-10-CM

## 2018-10-22 DIAGNOSIS — B349 Viral infection, unspecified: Secondary | ICD-10-CM | POA: Diagnosis not present

## 2018-10-22 DIAGNOSIS — R05 Cough: Secondary | ICD-10-CM | POA: Diagnosis not present

## 2018-10-25 DIAGNOSIS — F4323 Adjustment disorder with mixed anxiety and depressed mood: Secondary | ICD-10-CM | POA: Diagnosis not present

## 2019-01-07 ENCOUNTER — Other Ambulatory Visit: Payer: Self-pay | Admitting: Endocrinology

## 2019-01-08 NOTE — Telephone Encounter (Signed)
Please refill x 1 F/u is due  

## 2019-01-10 ENCOUNTER — Telehealth: Payer: Self-pay

## 2019-01-10 NOTE — Telephone Encounter (Signed)
Unable to process refill request without an appt. Called cell # but VM is not set up. Called home # but son advised he would have his mother return our call.

## 2019-01-31 ENCOUNTER — Other Ambulatory Visit: Payer: Self-pay | Admitting: Obstetrics & Gynecology

## 2019-02-04 ENCOUNTER — Other Ambulatory Visit: Payer: Self-pay | Admitting: Endocrinology

## 2019-02-22 DIAGNOSIS — B372 Candidiasis of skin and nail: Secondary | ICD-10-CM | POA: Diagnosis not present

## 2019-02-22 DIAGNOSIS — Z9884 Bariatric surgery status: Secondary | ICD-10-CM | POA: Diagnosis not present

## 2019-02-22 DIAGNOSIS — D509 Iron deficiency anemia, unspecified: Secondary | ICD-10-CM | POA: Diagnosis not present

## 2019-02-22 DIAGNOSIS — K210 Gastro-esophageal reflux disease with esophagitis: Secondary | ICD-10-CM | POA: Diagnosis not present

## 2019-02-23 DIAGNOSIS — Z6841 Body Mass Index (BMI) 40.0 and over, adult: Secondary | ICD-10-CM | POA: Diagnosis not present

## 2019-02-23 DIAGNOSIS — R7301 Impaired fasting glucose: Secondary | ICD-10-CM | POA: Diagnosis not present

## 2019-02-23 DIAGNOSIS — Z8619 Personal history of other infectious and parasitic diseases: Secondary | ICD-10-CM | POA: Diagnosis not present

## 2019-02-23 DIAGNOSIS — D509 Iron deficiency anemia, unspecified: Secondary | ICD-10-CM | POA: Diagnosis not present

## 2019-02-23 DIAGNOSIS — R1013 Epigastric pain: Secondary | ICD-10-CM | POA: Diagnosis not present

## 2019-04-19 DIAGNOSIS — Z8632 Personal history of gestational diabetes: Secondary | ICD-10-CM | POA: Diagnosis not present

## 2019-04-19 DIAGNOSIS — Z6841 Body Mass Index (BMI) 40.0 and over, adult: Secondary | ICD-10-CM | POA: Diagnosis not present

## 2019-04-19 DIAGNOSIS — D509 Iron deficiency anemia, unspecified: Secondary | ICD-10-CM | POA: Diagnosis not present

## 2019-04-19 DIAGNOSIS — E6609 Other obesity due to excess calories: Secondary | ICD-10-CM | POA: Diagnosis not present

## 2019-04-19 DIAGNOSIS — Z9884 Bariatric surgery status: Secondary | ICD-10-CM | POA: Diagnosis not present

## 2019-04-19 DIAGNOSIS — Z833 Family history of diabetes mellitus: Secondary | ICD-10-CM | POA: Diagnosis not present

## 2019-04-19 DIAGNOSIS — E538 Deficiency of other specified B group vitamins: Secondary | ICD-10-CM | POA: Diagnosis not present

## 2019-04-19 DIAGNOSIS — E782 Mixed hyperlipidemia: Secondary | ICD-10-CM | POA: Diagnosis not present

## 2019-04-19 DIAGNOSIS — R7301 Impaired fasting glucose: Secondary | ICD-10-CM | POA: Diagnosis not present

## 2019-04-19 DIAGNOSIS — K210 Gastro-esophageal reflux disease with esophagitis: Secondary | ICD-10-CM | POA: Diagnosis not present

## 2019-04-20 DIAGNOSIS — M9902 Segmental and somatic dysfunction of thoracic region: Secondary | ICD-10-CM | POA: Diagnosis not present

## 2019-04-20 DIAGNOSIS — M9908 Segmental and somatic dysfunction of rib cage: Secondary | ICD-10-CM | POA: Diagnosis not present

## 2019-04-20 DIAGNOSIS — M5386 Other specified dorsopathies, lumbar region: Secondary | ICD-10-CM | POA: Diagnosis not present

## 2019-04-20 DIAGNOSIS — M9903 Segmental and somatic dysfunction of lumbar region: Secondary | ICD-10-CM | POA: Diagnosis not present

## 2019-04-22 DIAGNOSIS — R1013 Epigastric pain: Secondary | ICD-10-CM | POA: Diagnosis not present

## 2019-04-22 DIAGNOSIS — E538 Deficiency of other specified B group vitamins: Secondary | ICD-10-CM | POA: Diagnosis not present

## 2019-04-22 DIAGNOSIS — Z9884 Bariatric surgery status: Secondary | ICD-10-CM | POA: Diagnosis not present

## 2019-04-22 DIAGNOSIS — D509 Iron deficiency anemia, unspecified: Secondary | ICD-10-CM | POA: Diagnosis not present

## 2019-04-25 DIAGNOSIS — K8020 Calculus of gallbladder without cholecystitis without obstruction: Secondary | ICD-10-CM | POA: Diagnosis not present

## 2019-04-25 DIAGNOSIS — K760 Fatty (change of) liver, not elsewhere classified: Secondary | ICD-10-CM | POA: Diagnosis not present

## 2019-04-25 DIAGNOSIS — R160 Hepatomegaly, not elsewhere classified: Secondary | ICD-10-CM | POA: Diagnosis not present

## 2019-04-27 DIAGNOSIS — M5386 Other specified dorsopathies, lumbar region: Secondary | ICD-10-CM | POA: Diagnosis not present

## 2019-04-27 DIAGNOSIS — M9903 Segmental and somatic dysfunction of lumbar region: Secondary | ICD-10-CM | POA: Diagnosis not present

## 2019-04-27 DIAGNOSIS — M9908 Segmental and somatic dysfunction of rib cage: Secondary | ICD-10-CM | POA: Diagnosis not present

## 2019-04-27 DIAGNOSIS — E538 Deficiency of other specified B group vitamins: Secondary | ICD-10-CM | POA: Diagnosis not present

## 2019-04-27 DIAGNOSIS — M9902 Segmental and somatic dysfunction of thoracic region: Secondary | ICD-10-CM | POA: Diagnosis not present

## 2019-05-04 DIAGNOSIS — M5386 Other specified dorsopathies, lumbar region: Secondary | ICD-10-CM | POA: Diagnosis not present

## 2019-05-04 DIAGNOSIS — M9903 Segmental and somatic dysfunction of lumbar region: Secondary | ICD-10-CM | POA: Diagnosis not present

## 2019-05-04 DIAGNOSIS — M9902 Segmental and somatic dysfunction of thoracic region: Secondary | ICD-10-CM | POA: Diagnosis not present

## 2019-05-04 DIAGNOSIS — E538 Deficiency of other specified B group vitamins: Secondary | ICD-10-CM | POA: Diagnosis not present

## 2019-05-04 DIAGNOSIS — M9908 Segmental and somatic dysfunction of rib cage: Secondary | ICD-10-CM | POA: Diagnosis not present

## 2019-05-11 DIAGNOSIS — E538 Deficiency of other specified B group vitamins: Secondary | ICD-10-CM | POA: Diagnosis not present

## 2019-05-13 DIAGNOSIS — Z9884 Bariatric surgery status: Secondary | ICD-10-CM | POA: Diagnosis not present

## 2019-05-13 DIAGNOSIS — F17200 Nicotine dependence, unspecified, uncomplicated: Secondary | ICD-10-CM | POA: Diagnosis not present

## 2019-05-13 DIAGNOSIS — Z6841 Body Mass Index (BMI) 40.0 and over, adult: Secondary | ICD-10-CM | POA: Diagnosis not present

## 2019-05-13 DIAGNOSIS — R7303 Prediabetes: Secondary | ICD-10-CM | POA: Diagnosis not present

## 2019-05-13 DIAGNOSIS — K8020 Calculus of gallbladder without cholecystitis without obstruction: Secondary | ICD-10-CM | POA: Diagnosis not present

## 2019-05-14 DIAGNOSIS — K8020 Calculus of gallbladder without cholecystitis without obstruction: Secondary | ICD-10-CM | POA: Diagnosis not present

## 2019-06-01 DIAGNOSIS — Z9884 Bariatric surgery status: Secondary | ICD-10-CM | POA: Diagnosis not present

## 2019-06-01 DIAGNOSIS — Z6841 Body Mass Index (BMI) 40.0 and over, adult: Secondary | ICD-10-CM | POA: Diagnosis not present

## 2019-06-01 DIAGNOSIS — Z01812 Encounter for preprocedural laboratory examination: Secondary | ICD-10-CM | POA: Diagnosis not present

## 2019-06-01 DIAGNOSIS — K8020 Calculus of gallbladder without cholecystitis without obstruction: Secondary | ICD-10-CM | POA: Diagnosis not present

## 2019-06-01 DIAGNOSIS — Z20828 Contact with and (suspected) exposure to other viral communicable diseases: Secondary | ICD-10-CM | POA: Diagnosis not present

## 2019-06-07 DIAGNOSIS — K820 Obstruction of gallbladder: Secondary | ICD-10-CM | POA: Diagnosis not present

## 2019-06-07 DIAGNOSIS — K8020 Calculus of gallbladder without cholecystitis without obstruction: Secondary | ICD-10-CM | POA: Diagnosis not present

## 2019-06-07 DIAGNOSIS — K660 Peritoneal adhesions (postprocedural) (postinfection): Secondary | ICD-10-CM | POA: Diagnosis not present

## 2019-06-07 DIAGNOSIS — Z980 Intestinal bypass and anastomosis status: Secondary | ICD-10-CM | POA: Diagnosis not present

## 2019-06-07 DIAGNOSIS — K829 Disease of gallbladder, unspecified: Secondary | ICD-10-CM | POA: Diagnosis not present

## 2019-06-07 DIAGNOSIS — K8010 Calculus of gallbladder with chronic cholecystitis without obstruction: Secondary | ICD-10-CM | POA: Diagnosis not present

## 2019-06-07 DIAGNOSIS — Z9884 Bariatric surgery status: Secondary | ICD-10-CM | POA: Diagnosis not present

## 2019-06-08 DIAGNOSIS — Z9884 Bariatric surgery status: Secondary | ICD-10-CM | POA: Diagnosis not present

## 2019-06-08 DIAGNOSIS — K8020 Calculus of gallbladder without cholecystitis without obstruction: Secondary | ICD-10-CM | POA: Diagnosis not present

## 2019-06-08 DIAGNOSIS — K660 Peritoneal adhesions (postprocedural) (postinfection): Secondary | ICD-10-CM | POA: Diagnosis not present

## 2019-06-08 DIAGNOSIS — Z980 Intestinal bypass and anastomosis status: Secondary | ICD-10-CM | POA: Diagnosis not present

## 2019-06-09 ENCOUNTER — Other Ambulatory Visit: Payer: Self-pay | Admitting: Endocrinology

## 2019-06-28 DIAGNOSIS — E538 Deficiency of other specified B group vitamins: Secondary | ICD-10-CM | POA: Diagnosis not present

## 2019-06-28 DIAGNOSIS — R8290 Unspecified abnormal findings in urine: Secondary | ICD-10-CM | POA: Diagnosis not present

## 2019-06-28 DIAGNOSIS — K2100 Gastro-esophageal reflux disease with esophagitis, without bleeding: Secondary | ICD-10-CM | POA: Diagnosis not present

## 2019-06-28 DIAGNOSIS — D509 Iron deficiency anemia, unspecified: Secondary | ICD-10-CM | POA: Diagnosis not present

## 2019-06-28 DIAGNOSIS — Z23 Encounter for immunization: Secondary | ICD-10-CM | POA: Diagnosis not present

## 2019-06-28 DIAGNOSIS — R7303 Prediabetes: Secondary | ICD-10-CM | POA: Diagnosis not present

## 2019-07-13 DIAGNOSIS — Z9884 Bariatric surgery status: Secondary | ICD-10-CM | POA: Diagnosis not present

## 2019-08-10 DIAGNOSIS — Z9884 Bariatric surgery status: Secondary | ICD-10-CM | POA: Diagnosis not present

## 2019-08-10 DIAGNOSIS — Z713 Dietary counseling and surveillance: Secondary | ICD-10-CM | POA: Diagnosis not present

## 2019-08-10 DIAGNOSIS — Z6841 Body Mass Index (BMI) 40.0 and over, adult: Secondary | ICD-10-CM | POA: Diagnosis not present

## 2019-08-15 DIAGNOSIS — Z9884 Bariatric surgery status: Secondary | ICD-10-CM | POA: Diagnosis not present

## 2019-08-26 ENCOUNTER — Other Ambulatory Visit: Payer: Self-pay | Admitting: Physician Assistant

## 2019-08-26 DIAGNOSIS — R635 Abnormal weight gain: Secondary | ICD-10-CM

## 2019-08-26 DIAGNOSIS — Z9884 Bariatric surgery status: Secondary | ICD-10-CM

## 2019-09-05 ENCOUNTER — Ambulatory Visit
Admission: RE | Admit: 2019-09-05 | Discharge: 2019-09-05 | Disposition: A | Discharge status: Home / Self Care | Payer: BLUE CROSS/BLUE SHIELD | Source: Ambulatory Visit | Attending: Physician Assistant | Admitting: Physician Assistant

## 2019-09-05 DIAGNOSIS — R635 Abnormal weight gain: Secondary | ICD-10-CM

## 2019-09-05 DIAGNOSIS — Z9884 Bariatric surgery status: Secondary | ICD-10-CM

## 2019-09-07 ENCOUNTER — Other Ambulatory Visit: Payer: Self-pay | Admitting: Physician Assistant

## 2019-09-07 DIAGNOSIS — R635 Abnormal weight gain: Secondary | ICD-10-CM

## 2019-09-07 DIAGNOSIS — Z9884 Bariatric surgery status: Secondary | ICD-10-CM

## 2019-09-08 ENCOUNTER — Other Ambulatory Visit: Payer: Self-pay | Admitting: Physician Assistant

## 2019-09-08 ENCOUNTER — Ambulatory Visit
Admission: RE | Admit: 2019-09-08 | Discharge: 2019-09-08 | Disposition: A | Discharge status: Home / Self Care | Payer: BC Managed Care – PPO | Source: Ambulatory Visit | Attending: Physician Assistant | Admitting: Physician Assistant

## 2019-09-08 DIAGNOSIS — Z9884 Bariatric surgery status: Secondary | ICD-10-CM

## 2019-09-08 DIAGNOSIS — R635 Abnormal weight gain: Secondary | ICD-10-CM

## 2020-09-25 ENCOUNTER — Other Ambulatory Visit: Payer: BLUE CROSS/BLUE SHIELD

## 2020-09-28 ENCOUNTER — Other Ambulatory Visit: Payer: BC Managed Care – PPO

## 2020-12-18 ENCOUNTER — Emergency Department (HOSPITAL_BASED_OUTPATIENT_CLINIC_OR_DEPARTMENT_OTHER)
Admission: EM | Admit: 2020-12-18 | Discharge: 2020-12-19 | Disposition: A | Discharge status: Home / Self Care | Payer: BLUE CROSS/BLUE SHIELD | Attending: Emergency Medicine | Admitting: Emergency Medicine

## 2020-12-18 ENCOUNTER — Encounter (HOSPITAL_BASED_OUTPATIENT_CLINIC_OR_DEPARTMENT_OTHER): Payer: Self-pay

## 2020-12-18 ENCOUNTER — Emergency Department (HOSPITAL_BASED_OUTPATIENT_CLINIC_OR_DEPARTMENT_OTHER): Payer: BLUE CROSS/BLUE SHIELD

## 2020-12-18 ENCOUNTER — Other Ambulatory Visit: Payer: Self-pay

## 2020-12-18 DIAGNOSIS — R519 Headache, unspecified: Secondary | ICD-10-CM | POA: Diagnosis present

## 2020-12-18 DIAGNOSIS — R110 Nausea: Secondary | ICD-10-CM | POA: Diagnosis not present

## 2020-12-18 DIAGNOSIS — E119 Type 2 diabetes mellitus without complications: Secondary | ICD-10-CM | POA: Diagnosis not present

## 2020-12-18 DIAGNOSIS — R42 Dizziness and giddiness: Secondary | ICD-10-CM | POA: Insufficient documentation

## 2020-12-18 DIAGNOSIS — G43409 Hemiplegic migraine, not intractable, without status migrainosus: Secondary | ICD-10-CM | POA: Insufficient documentation

## 2020-12-18 DIAGNOSIS — F17210 Nicotine dependence, cigarettes, uncomplicated: Secondary | ICD-10-CM | POA: Diagnosis not present

## 2020-12-18 MED ORDER — SODIUM CHLORIDE 0.9 % IV BOLUS
1000.0000 mL | Freq: Once | INTRAVENOUS | Status: AC
  Administered 2020-12-18: 1000 mL via INTRAVENOUS

## 2020-12-18 MED ORDER — KETOROLAC TROMETHAMINE 15 MG/ML IJ SOLN
15.0000 mg | Freq: Once | INTRAMUSCULAR | Status: AC
  Administered 2020-12-18: 15 mg via INTRAVENOUS
  Filled 2020-12-18: qty 1

## 2020-12-18 MED ORDER — DIPHENHYDRAMINE HCL 50 MG/ML IJ SOLN
25.0000 mg | Freq: Once | INTRAMUSCULAR | Status: AC
  Administered 2020-12-18: 25 mg via INTRAVENOUS
  Filled 2020-12-18: qty 1

## 2020-12-18 MED ORDER — METOCLOPRAMIDE HCL 5 MG/ML IJ SOLN
10.0000 mg | Freq: Once | INTRAMUSCULAR | Status: AC
  Administered 2020-12-18: 10 mg via INTRAVENOUS
  Filled 2020-12-18: qty 2

## 2020-12-18 NOTE — ED Triage Notes (Signed)
Has had headache and increased blood pressure, acute onset. No history of hypertension.

## 2020-12-18 NOTE — ED Notes (Signed)
Patient transported to CT 

## 2020-12-18 NOTE — ED Provider Notes (Signed)
[facility] Provider Note: Monica Spurling, MD, FACEP  CSN: [id number] MRN: [id number] ARRIVAL: 12/18/20 at 73 ROOM: DB007/DB007   CHIEF COMPLAINT  Headache   HISTORY OF PRESENT ILLNESS  12/18/20 11:01 PM [name] is a 45 y.o. female without a history of hypertension.  She developed the sudden onset of a bitemporal headache about an hour prior to arrival.  She rates the headache is an 8 out of 10, throbbing in nature.  She took her blood pressure at home and found it to be elevated with a systolic of 703.  On arrival here her blood pressure is 185/102.  She has associated nausea, vertigo (sense of movement especially with change in position) but no vomiting.  She denies chest pain or shortness of breath.    Past Medical History:  Diagnosis Date  . Anemia   . Decreased fetal movement 05/08/2015  . Diabetes mellitus without complication ([place])    gestational per OR consent order  . H/O infertility   . Medical history non-contributory   . Sciatica     Past Surgical History:  Procedure Laterality Date  . ABDOMINAL SURGERY  MAY 2008   GASTRIC BYPASS  . CESAREAN SECTION     x1  . CESAREAN SECTION N/A 05/18/2015   Procedure: Repeat CESAREAN SECTION;  Surgeon: Servando Salina, MD;  Location: [facility];  Service: Obstetrics;  Laterality: N/A;  EDD: 05/21/15  . KNEE SURGERY Right     Family History  Problem Relation Age of Onset  . Diabetes Mother   . Hypertension Father   . Diabetes Maternal Grandmother   . Diabetes Paternal Grandmother   . Polycystic ovary syndrome Neg Hx     Social History   Tobacco Use  . Smoking status: Current Some Day Smoker    Types: Cigarettes  . Smokeless tobacco: Never Used  Vaping Use  . Vaping Use: Never used  Substance Use Topics  . Alcohol use: No    Alcohol/week: 0.0 standard drinks  . Drug use: No    Prior to Admission medications   Medication Sig Start Date End Date Taking? Authorizing Provider  CONTOUR NEXT  TEST test strip USE AS DIRECTED ONCE DAILY 01/10/19   Renato Shin, MD  PROCTOSOL HC 2.5 % rectal cream PLACE 1 APPLICATION RECTALLY 2 (TWO) TIMES DAILY FOR 14 DAYS. 02/01/19   Princess Bruins, MD    Allergies Patient has no known allergies.   REVIEW OF SYSTEMS  Negative except as noted here or in the History of Present Illness.   PHYSICAL EXAMINATION  Initial Vital Signs Blood pressure (!) 185/102, pulse 60, temperature 97.9 F (36.6 C), temperature source Oral, resp. rate 18, height 6\' 1"  (1.854 m), weight (!) 145.2 kg, SpO2 100 %, currently breastfeeding.  Examination General: Well-developed, well-nourished female in no acute distress; appearance consistent with age of record HENT: normocephalic; atraumatic Eyes: pupils equal, round and reactive to light; extraocular muscles intact Neck: supple Heart: regular rate and rhythm Lungs: clear to auscultation bilaterally Abdomen: soft; nondistended; nontender; bowel sounds present Extremities: No deformity; full range of motion; pulses normal Neurologic: Awake, alert and oriented; motor function intact in all extremities and symmetric; no facial droop; normal coordination and speech; normal finger-to-nose; no pronator drift Skin: Warm and dry Psychiatric: Normal mood and affect   RESULTS  Summary of this visit's results, reviewed and interpreted by myself:   EKG Interpretation  Date/Time:  Tuesday December 18 2020 22:58:07 EDT Ventricular Rate:  66 PR Interval:  191 QRS Duration: 90 QT Interval:  404 QTC Calculation: 424 R Axis:   31 Text Interpretation: Sinus rhythm Normal ECG No previous ECGs available Confirmed by Brenley Priore [phone]) on 12/18/2020 11:01:26 PM      Laboratory Studies: No results found for this or any previous visit (from the past 24 hour(s)). Imaging Studies: CT Head Wo Contrast  Result Date: 12/18/2020 CLINICAL DATA:  Headache and increased blood pressure. EXAM: CT HEAD WITHOUT CONTRAST TECHNIQUE:  Contiguous axial images were obtained from the base of the skull through the vertex without intravenous contrast. COMPARISON:  None. FINDINGS: Brain: No evidence of acute infarction, hemorrhage, hydrocephalus, extra-axial collection or mass lesion/mass effect. Vascular: No hyperdense vessel or unexpected calcification. Skull: Normal. Negative for fracture or focal lesion. Sinuses/Orbits: No acute finding. Other: None. IMPRESSION: No acute intracranial pathology. Electronically Signed   By: Virgina Norfolk M.D.   On: 12/18/2020 23:28    ED COURSE and MDM  Nursing notes, initial and subsequent vitals signs, including pulse oximetry, reviewed and interpreted by myself.  Vitals:   12/18/20 2249 12/18/20 2250 12/18/20 2345 12/19/20 0000  BP: (!) 185/102  138/84 131/79  Pulse: 60  (!) 59 64  Resp: 18  18 16   Temp: 97.9 F (36.6 C)     TempSrc: Oral     SpO2: 100%  100% 100%  Weight:  (!) 145.2 kg    Height:  6\' 1"  (1.854 m)     Medications  sodium chloride 0.9 % bolus 1,000 mL (1,000 mLs Intravenous New Bag/Given 12/18/20 2337)  diphenhydrAMINE (BENADRYL) injection 25 mg (25 mg Intravenous Given 12/18/20 2334)  metoCLOPramide (REGLAN) injection 10 mg (10 mg Intravenous Given 12/18/20 2334)  ketorolac (TORADOL) 15 MG/ML injection 15 mg (15 mg Intravenous Given 12/18/20 2334)   12:12 AM Patient feeling much better after IV meds. Presentation consistent with sporadic migraine or migraine-like headache. No neurologic deficits noted and CT head WNL. BP now 131/79, unclear whether BP elevation was cause or effect of headache.   PROCEDURES  Procedures   ED DIAGNOSES     ICD-10-CM   1. Sporadic migraine  G43.[place]        Nicole Defino, MD 12/19/20 [phone]

## 2020-12-18 NOTE — ED Notes (Signed)
ED Provider at bedside. 

## 2022-05-04 IMAGING — CT CT HEAD W/O CM
3 series · 18 of 40 positions shown, 20 images · non-contrast
Comparison: None.

CLINICAL DATA: Headache and increased blood pressure.

EXAM:
CT HEAD WITHOUT CONTRAST
TECHNIQUE: Contiguous axial images were obtained from the base of the skull
through the vertex without intravenous contrast.

[Series 2: head bone · axial · 0.46mm/px · z∈[-45,+87]mm · 8 of 84 slices shown]
[im 9/84  bone]
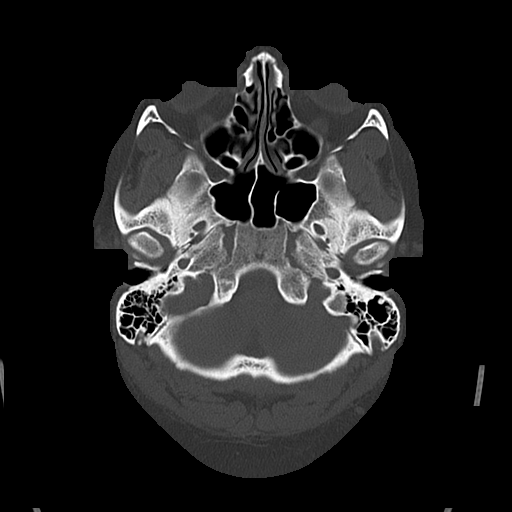
[im 17/84  bone]
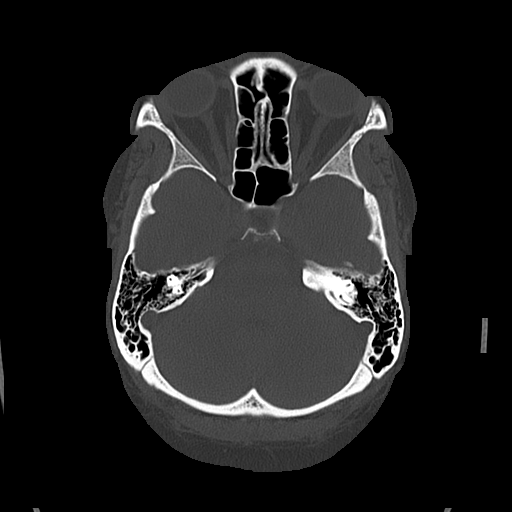
[im 25/84  bone]
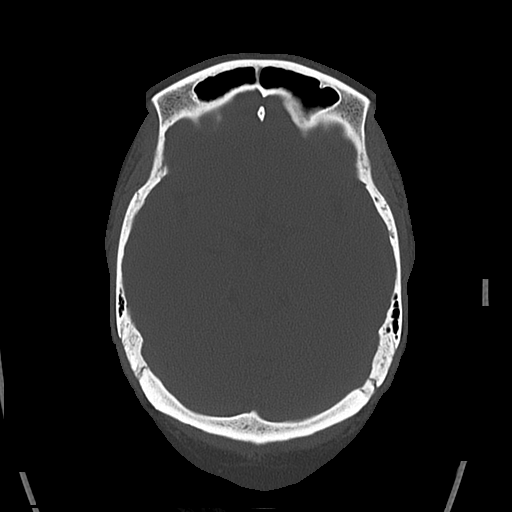
[im 38/84  bone]
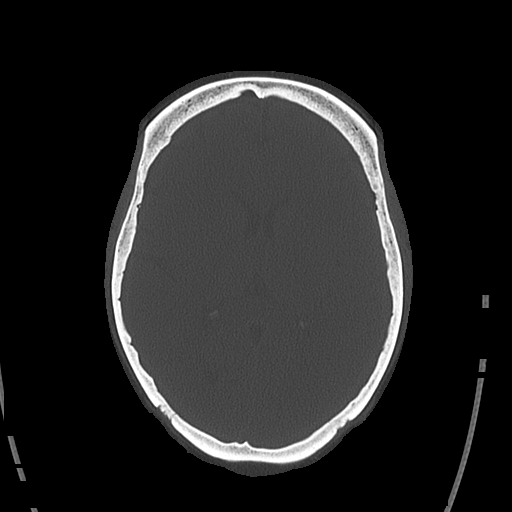
[im 46/84  bone]
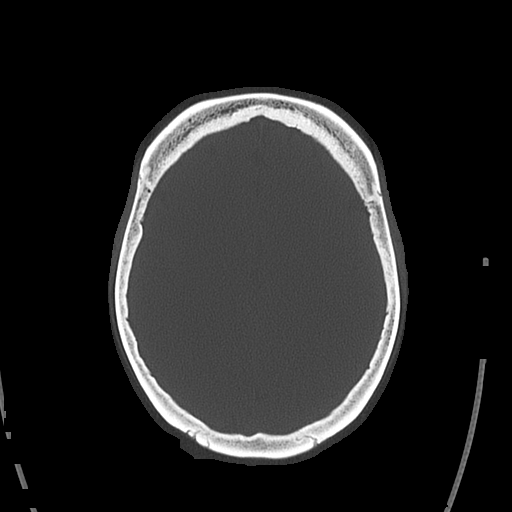
[im 59/84  bone]
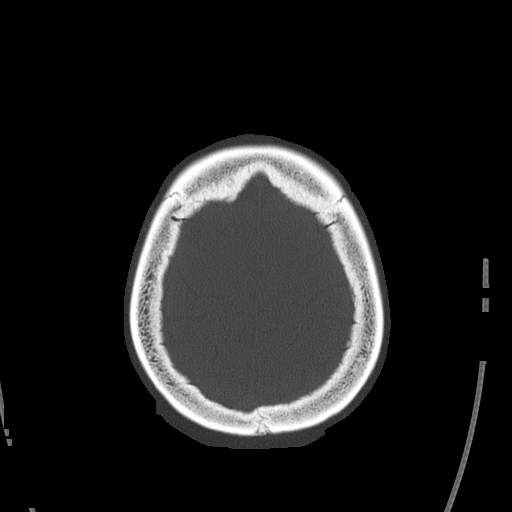
[im 67/84  bone]
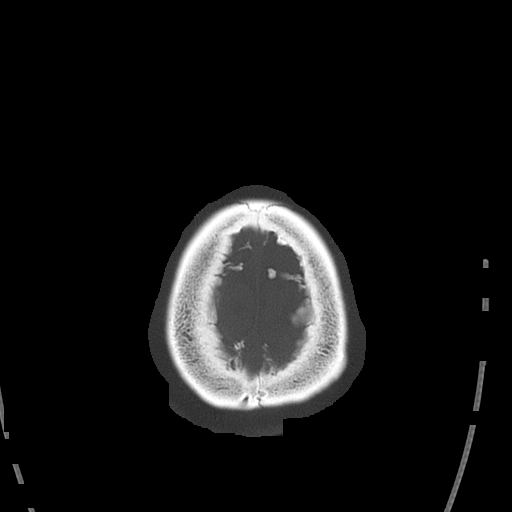
[im 75/84  bone]
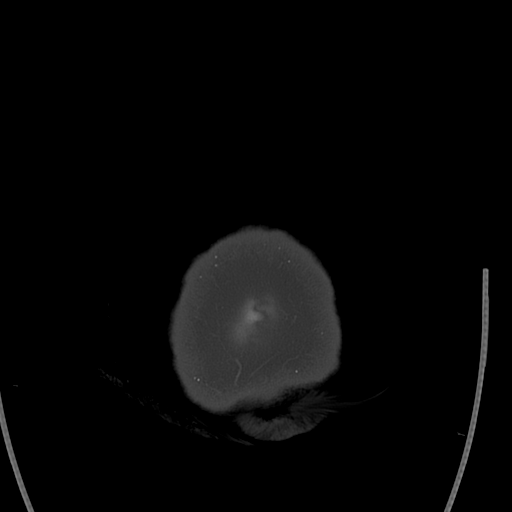

[Series 3: head wo · axial · 0.46mm/px · z∈[-41,+79]mm · 7 of 34 slices shown, 9 images]
[im 5/34  brain]
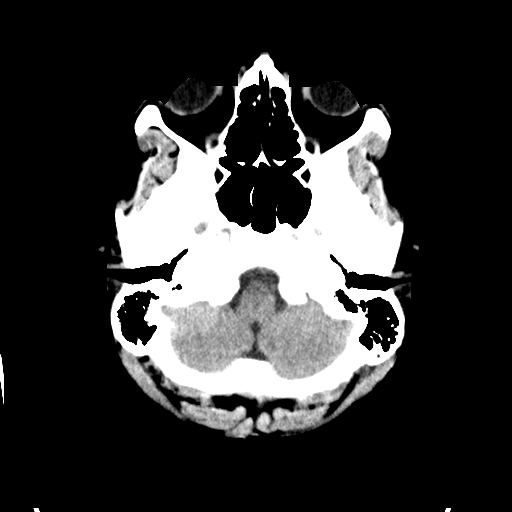
[im 5/34  bone]
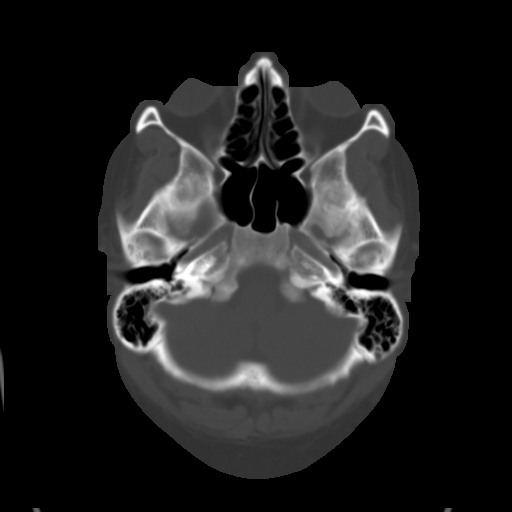
[im 9/34  brain]
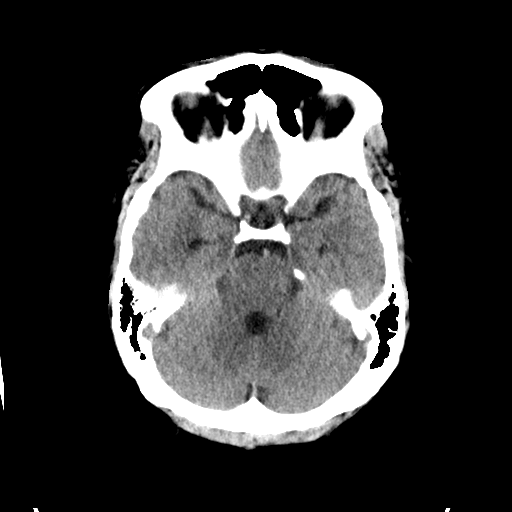
[im 13/34  brain]
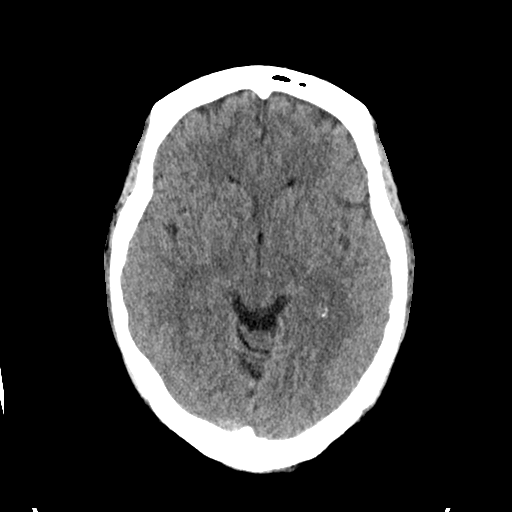
[im 17/34  brain]
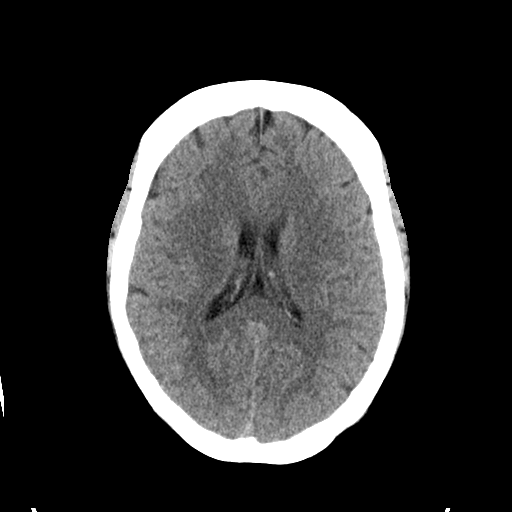
[im 21/34  brain]
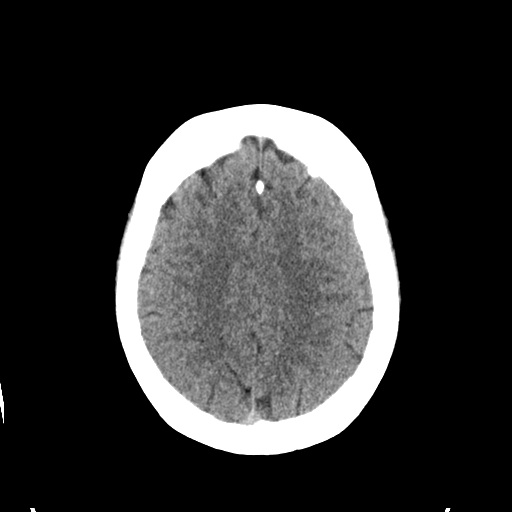
[im 21/34  bone]
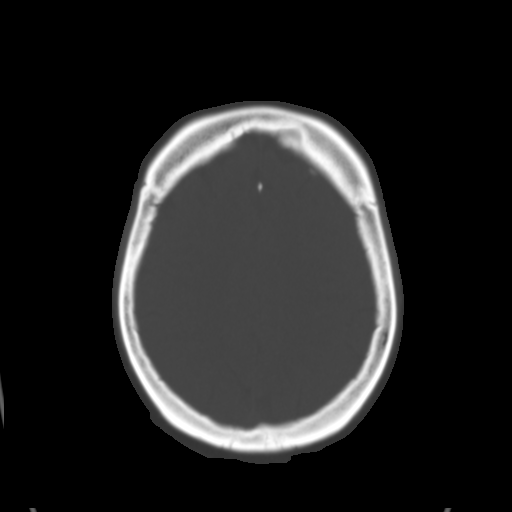
[im 25/34  brain]
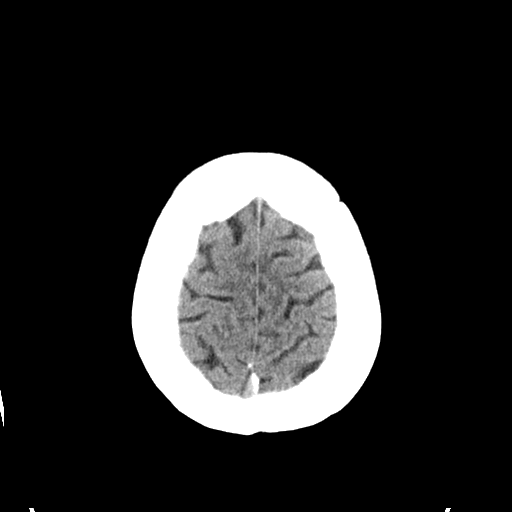
[im 29/34  brain]
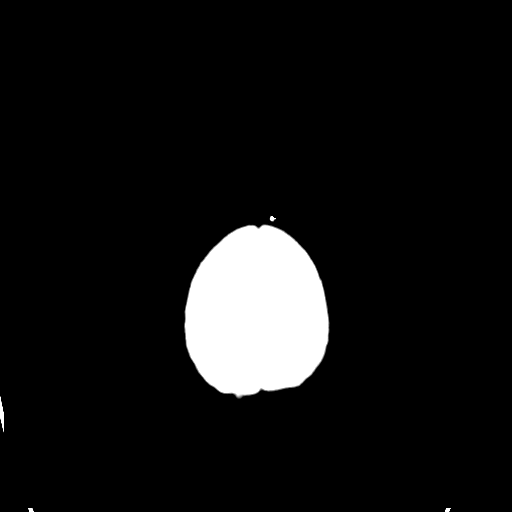

[Series 4: coronal soft · coronal · 0.34mm/px · 3 of 64 slices shown]
[im 22/64  brain]
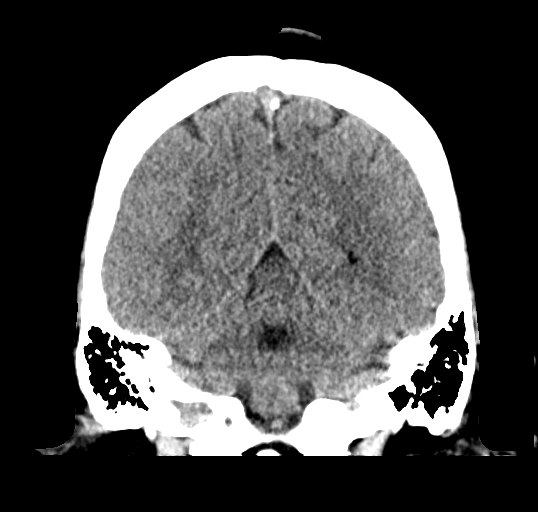
[im 29/64  brain]
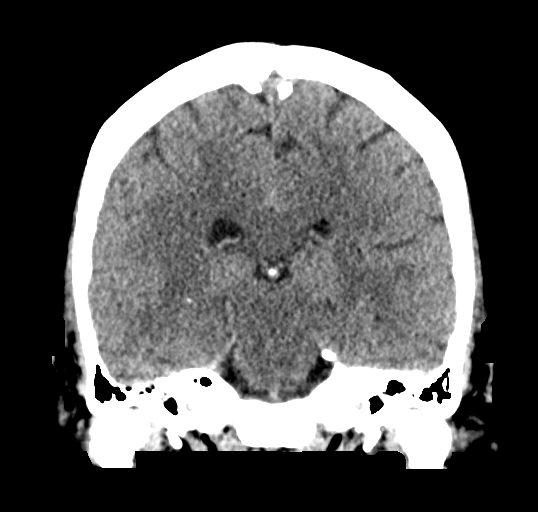
[im 36/64  brain]
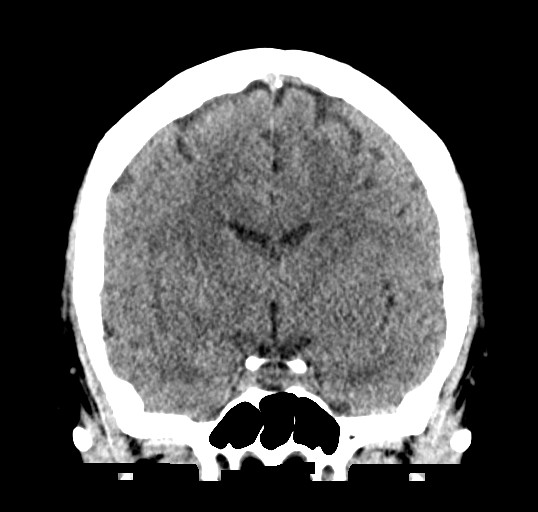

[18 of 40 positions shown; findings below may reference images not displayed]

FINDINGS: Brain: No evidence of acute infarction, hemorrhage, hydrocephalus,
extra-axial collection or mass lesion/mass effect.

Vascular: No hyperdense vessel or unexpected calcification.

Skull: Normal. Negative for fracture or focal lesion.

Sinuses/Orbits: No acute finding.

Other: None.
IMPRESSION: No acute intracranial pathology.
